# Patient Record
Sex: Female | Born: 2000
Health system: Southern US, Community
[De-identification: ages and names within clinical notes are randomized; demographics above are authoritative.]

## PROBLEM LIST (undated history)

## (undated) ENCOUNTER — Inpatient Hospital Stay (HOSPITAL_COMMUNITY): Payer: Self-pay

## (undated) DIAGNOSIS — Z789 Other specified health status: Secondary | ICD-10-CM

## (undated) HISTORY — DX: Other specified health status: Z78.9

## (undated) HISTORY — PX: NO PAST SURGERIES: SHX2092

---
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
## 2020-06-26 DIAGNOSIS — Z3400 Encounter for supervision of normal first pregnancy, unspecified trimester: Secondary | ICD-10-CM | POA: Diagnosis not present

## 2020-06-26 DIAGNOSIS — Z3401 Encounter for supervision of normal first pregnancy, first trimester: Secondary | ICD-10-CM | POA: Diagnosis not present

## 2020-06-26 DIAGNOSIS — Z3201 Encounter for pregnancy test, result positive: Secondary | ICD-10-CM | POA: Diagnosis not present

## 2020-06-26 LAB — OB RESULTS CONSOLE HIV ANTIBODY (ROUTINE TESTING): HIV: NONREACTIVE

## 2020-06-26 LAB — OB RESULTS CONSOLE HEPATITIS B SURFACE ANTIGEN: Hepatitis B Surface Ag: NEGATIVE

## 2020-06-26 LAB — OB RESULTS CONSOLE RUBELLA ANTIBODY, IGM: Rubella: IMMUNE

## 2020-07-07 DIAGNOSIS — O209 Hemorrhage in early pregnancy, unspecified: Secondary | ICD-10-CM | POA: Diagnosis not present

## 2020-07-07 DIAGNOSIS — Z3401 Encounter for supervision of normal first pregnancy, first trimester: Secondary | ICD-10-CM | POA: Diagnosis not present

## 2020-08-18 DIAGNOSIS — Z3A18 18 weeks gestation of pregnancy: Secondary | ICD-10-CM | POA: Diagnosis not present

## 2020-08-18 DIAGNOSIS — Z36 Encounter for antenatal screening for chromosomal anomalies: Secondary | ICD-10-CM | POA: Diagnosis not present

## 2020-08-18 DIAGNOSIS — Z3402 Encounter for supervision of normal first pregnancy, second trimester: Secondary | ICD-10-CM | POA: Diagnosis not present

## 2020-09-01 DIAGNOSIS — Z202 Contact with and (suspected) exposure to infections with a predominantly sexual mode of transmission: Secondary | ICD-10-CM | POA: Diagnosis not present

## 2020-09-01 LAB — OB RESULTS CONSOLE GC/CHLAMYDIA
Chlamydia: NEGATIVE
Gonorrhea: NEGATIVE

## 2020-10-17 NOTE — L&D Delivery Note (Signed)
Delivery Note At 5:52 PM a viable female was delivered via Vaginal, Spontaneous (Presentation:   Occiput Anterior).  APGAR: 8, 9; weight  .   Placenta status: Spontaneous, Intact.  Cord: 3 vessels with the following complications: None.  Cord pH: NA  Anesthesia: Epidural Episiotomy: None Lacerations: 1st degree;Perineal bilateral labia lacerations  Suture Repair: 3.0 vicryl Est. Blood Loss (mL):  400 mL uterine atony noted resolved with pitocin, fundal message and 1000 mcg of cytotec per rectum   Mom to postpartum.  Baby to Couplet care / Skin to Skin.  Gerald Leitz 01/09/2021, 6:45 PM

## 2020-10-21 DIAGNOSIS — Z3490 Encounter for supervision of normal pregnancy, unspecified, unspecified trimester: Secondary | ICD-10-CM | POA: Diagnosis not present

## 2020-10-21 LAB — OB RESULTS CONSOLE RPR: RPR: NONREACTIVE

## 2020-11-23 DIAGNOSIS — Z23 Encounter for immunization: Secondary | ICD-10-CM | POA: Diagnosis not present

## 2020-12-21 DIAGNOSIS — Z3403 Encounter for supervision of normal first pregnancy, third trimester: Secondary | ICD-10-CM | POA: Diagnosis not present

## 2020-12-21 DIAGNOSIS — Z3490 Encounter for supervision of normal pregnancy, unspecified, unspecified trimester: Secondary | ICD-10-CM | POA: Diagnosis not present

## 2020-12-21 LAB — OB RESULTS CONSOLE GBS: GBS: NEGATIVE

## 2021-01-04 ENCOUNTER — Telehealth (HOSPITAL_COMMUNITY): Payer: Self-pay | Admitting: *Deleted

## 2021-01-04 ENCOUNTER — Encounter (HOSPITAL_COMMUNITY): Payer: Self-pay | Admitting: *Deleted

## 2021-01-04 NOTE — Telephone Encounter (Signed)
Preadmission screen  

## 2021-01-07 ENCOUNTER — Other Ambulatory Visit (HOSPITAL_COMMUNITY)
Admission: RE | Admit: 2021-01-07 | Discharge: 2021-01-07 | Disposition: A | Discharge status: Home / Self Care | Payer: Medicaid Other | Source: Ambulatory Visit | Attending: Obstetrics and Gynecology | Admitting: Obstetrics and Gynecology

## 2021-01-07 LAB — SARS CORONAVIRUS 2 (TAT 6-24 HRS): SARS Coronavirus 2: NEGATIVE

## 2021-01-08 ENCOUNTER — Other Ambulatory Visit: Payer: Self-pay

## 2021-01-08 ENCOUNTER — Other Ambulatory Visit: Payer: Self-pay | Admitting: Obstetrics and Gynecology

## 2021-01-08 NOTE — H&P (Signed)
Carol Lopez is a 20 y.o. female G1P0 at 70 weeks and 2 days presenting for induction of labor due to term pregnancy with favorable cervix. Pregnancy has been uncomplicated. Prenatal care provided by Dr. Gerald Leitz with [facility].  . OB History    Gravida  1   Para      Term      Preterm      AB      Living        SAB      IAB      Ectopic      Multiple      Live Births             Past Medical History:  Diagnosis Date  . Medical history non-contributory    Past Surgical History:  Procedure Laterality Date  . NO PAST SURGERIES     Family History: family history includes Diabetes in her maternal grandmother and mother. Social History:  reports that she has never smoked. She has never used smokeless tobacco. No history on file for alcohol use and drug use.     Maternal Diabetes: No Genetic Screening: Declined Maternal Ultrasounds/Referrals: Normal Fetal Ultrasounds or other Referrals:  None Maternal Substance Abuse:  No Significant Maternal Medications:  None Significant Maternal Lab Results:  Group B Strep negative Other Comments:  None  Review of Systems  Constitutional: Negative.   HENT: Negative.   Eyes: Negative.   Respiratory: Negative.   Cardiovascular: Negative.   Gastrointestinal: Negative.   Endocrine: Negative.   Genitourinary: Negative.   Musculoskeletal: Negative.   Skin: Negative.   Allergic/Immunologic: Negative.   Neurological: Negative.   Hematological: Negative.   Psychiatric/Behavioral: Negative.    History   There were no vitals taken for this visit. Maternal Exam:  Introitus: Normal vulva.   Physical Exam Vitals reviewed.  Constitutional:      Appearance: Normal appearance.  HENT:     Head: Normocephalic and atraumatic.  Eyes:     Pupils: Pupils are equal, round, and reactive to light.  Cardiovascular:     Rate and Rhythm: Normal rate and regular rhythm.     Pulses: Normal pulses.     Heart sounds: Normal  heart sounds.  Pulmonary:     Effort: Pulmonary effort is normal.     Breath sounds: Normal breath sounds.  Abdominal:     Tenderness: There is no abdominal tenderness.  Genitourinary:    General: Normal vulva.  Musculoskeletal:        General: Normal range of motion.     Cervical back: Normal range of motion and neck supple.  Skin:    General: Skin is warm and dry.  Neurological:     General: No focal deficit present.     Mental Status: She is alert and oriented to person, place, and time.  Psychiatric:        Mood and Affect: Mood normal.        Behavior: Behavior normal.     Prenatal labs: ABO, Rh:  B positive  Antibody:  Negative  Rubella:  Immune  RPR:   Nonreactive  HBsAg:  Negative   HIV:   Negative  GBS:  Negative on 12/21/2020   Assessment/Plan: 39 weeks and 2 days for induction  Favorable cervix start pitocin 1x1 Plan AROM with fetal descent  Anticipate SVD    Gerald Leitz 01/08/2021, 5:55 PM

## 2021-01-08 NOTE — H&P (Deleted)
  The note originally documented on this encounter has been moved the the encounter in which it belongs.  

## 2021-01-09 ENCOUNTER — Encounter (HOSPITAL_COMMUNITY): Payer: Self-pay | Admitting: Obstetrics and Gynecology

## 2021-01-09 ENCOUNTER — Other Ambulatory Visit: Payer: Self-pay

## 2021-01-09 ENCOUNTER — Inpatient Hospital Stay (HOSPITAL_COMMUNITY)
Admission: AD | Admit: 2021-01-09 | Discharge: 2021-01-11 | DRG: 806 | Disposition: A | Discharge status: Home / Self Care | Payer: Medicaid Other | Attending: Obstetrics and Gynecology | Admitting: Obstetrics and Gynecology

## 2021-01-09 ENCOUNTER — Inpatient Hospital Stay (HOSPITAL_COMMUNITY): Payer: Medicaid Other | Admitting: Anesthesiology

## 2021-01-09 ENCOUNTER — Inpatient Hospital Stay (HOSPITAL_COMMUNITY): Payer: Medicaid Other

## 2021-01-09 DIAGNOSIS — O411230 Chorioamnionitis, third trimester, not applicable or unspecified: Secondary | ICD-10-CM | POA: Diagnosis not present

## 2021-01-09 DIAGNOSIS — O411231 Chorioamnionitis, third trimester, fetus 1: Secondary | ICD-10-CM

## 2021-01-09 DIAGNOSIS — Z3A39 39 weeks gestation of pregnancy: Secondary | ICD-10-CM

## 2021-01-09 DIAGNOSIS — O9081 Anemia of the puerperium: Secondary | ICD-10-CM | POA: Diagnosis not present

## 2021-01-09 DIAGNOSIS — Z20822 Contact with and (suspected) exposure to covid-19: Secondary | ICD-10-CM | POA: Diagnosis present

## 2021-01-09 DIAGNOSIS — Z3490 Encounter for supervision of normal pregnancy, unspecified, unspecified trimester: Secondary | ICD-10-CM

## 2021-01-09 DIAGNOSIS — D62 Acute posthemorrhagic anemia: Secondary | ICD-10-CM | POA: Diagnosis not present

## 2021-01-09 DIAGNOSIS — O26893 Other specified pregnancy related conditions, third trimester: Secondary | ICD-10-CM | POA: Diagnosis not present

## 2021-01-09 LAB — CBC
HCT: 29.6 % — ABNORMAL LOW (ref 36.0–46.0)
Hemoglobin: 9.4 g/dL — ABNORMAL LOW (ref 12.0–15.0)
MCH: 25.1 pg — ABNORMAL LOW (ref 26.0–34.0)
MCHC: 31.8 g/dL (ref 30.0–36.0)
MCV: 79.1 fL — ABNORMAL LOW (ref 80.0–100.0)
Platelets: 253 10*3/uL (ref 150–400)
RBC: 3.74 MIL/uL — ABNORMAL LOW (ref 3.87–5.11)
RDW: 14.1 % (ref 11.5–15.5)
WBC: 8.9 10*3/uL (ref 4.0–10.5)
nRBC: 0 % (ref 0.0–0.2)

## 2021-01-09 LAB — RPR: RPR Ser Ql: NONREACTIVE

## 2021-01-09 LAB — TYPE AND SCREEN
ABO/RH(D): B POS
Antibody Screen: NEGATIVE

## 2021-01-09 MED ORDER — ACETAMINOPHEN 325 MG PO TABS
650.0000 mg | ORAL_TABLET | ORAL | Status: DC | PRN
  Filled 2021-01-09: qty 2

## 2021-01-09 MED ORDER — MISOPROSTOL 200 MCG PO TABS
ORAL_TABLET | ORAL | Status: AC
  Filled 2021-01-09: qty 5

## 2021-01-09 MED ORDER — METHYLERGONOVINE MALEATE 0.2 MG PO TABS: 0.2000 mg | ORAL_TABLET | ORAL | Status: DC | PRN

## 2021-01-09 MED ORDER — WITCH HAZEL-GLYCERIN EX PADS: 1.0000 "application " | MEDICATED_PAD | CUTANEOUS | Status: DC | PRN

## 2021-01-09 MED ORDER — OXYCODONE-ACETAMINOPHEN 5-325 MG PO TABS
2.0000 | ORAL_TABLET | ORAL | Status: DC | PRN
Start: 2021-01-09 — End: 2021-01-09

## 2021-01-09 MED ORDER — EPHEDRINE 5 MG/ML INJ: 10.0000 mg | INTRAVENOUS | Status: DC | PRN

## 2021-01-09 MED ORDER — LACTATED RINGERS IV SOLN: 500.0000 mL | Freq: Once | INTRAVENOUS | Status: DC

## 2021-01-09 MED ORDER — OXYCODONE HCL 5 MG PO TABS: 5.0000 mg | ORAL_TABLET | ORAL | Status: DC | PRN

## 2021-01-09 MED ORDER — ONDANSETRON HCL 4 MG PO TABS: 4.0000 mg | ORAL_TABLET | ORAL | Status: DC | PRN

## 2021-01-09 MED ORDER — FENTANYL CITRATE (PF) 100 MCG/2ML IJ SOLN: 50.0000 ug | INTRAMUSCULAR | Status: DC | PRN

## 2021-01-09 MED ORDER — SODIUM CHLORIDE 0.9 % IV SOLN
3.0000 g | Freq: Four times a day (QID) | INTRAVENOUS | Status: AC
  Administered 2021-01-09 – 2021-01-10 (×4): 3 g via INTRAVENOUS
  Filled 2021-01-09 (×2): qty 3
  Filled 2021-01-09 (×2): qty 8

## 2021-01-09 MED ORDER — LACTATED RINGERS IV SOLN
500.0000 mL | Freq: Once | INTRAVENOUS | Status: AC
  Administered 2021-01-09: 500 mL via INTRAVENOUS

## 2021-01-09 MED ORDER — OXYTOCIN BOLUS FROM INFUSION
333.0000 mL | Freq: Once | INTRAVENOUS | Status: AC
  Administered 2021-01-09: 333 mL via INTRAVENOUS

## 2021-01-09 MED ORDER — OXYCODONE-ACETAMINOPHEN 5-325 MG PO TABS: 1.0000 | ORAL_TABLET | ORAL | Status: DC | PRN

## 2021-01-09 MED ORDER — ACETAMINOPHEN 500 MG PO TABS
1000.0000 mg | ORAL_TABLET | Freq: Once | ORAL | Status: AC
  Administered 2021-01-09: 1000 mg via ORAL
  Filled 2021-01-09: qty 2

## 2021-01-09 MED ORDER — IBUPROFEN 600 MG PO TABS
600.0000 mg | ORAL_TABLET | Freq: Four times a day (QID) | ORAL | Status: DC
  Administered 2021-01-10 – 2021-01-11 (×6): 600 mg via ORAL
  Filled 2021-01-09 (×7): qty 1

## 2021-01-09 MED ORDER — SENNOSIDES-DOCUSATE SODIUM 8.6-50 MG PO TABS
2.0000 | ORAL_TABLET | Freq: Every day | ORAL | Status: DC
  Filled 2021-01-09 (×2): qty 2

## 2021-01-09 MED ORDER — DIPHENHYDRAMINE HCL 25 MG PO CAPS: 25.0000 mg | ORAL_CAPSULE | Freq: Four times a day (QID) | ORAL | Status: DC | PRN

## 2021-01-09 MED ORDER — FERROUS SULFATE 325 (65 FE) MG PO TABS
325.0000 mg | ORAL_TABLET | Freq: Every day | ORAL | Status: DC
  Administered 2021-01-10 – 2021-01-11 (×2): 325 mg via ORAL
  Filled 2021-01-09 (×2): qty 1

## 2021-01-09 MED ORDER — TERBUTALINE SULFATE 1 MG/ML IJ SOLN: 0.2500 mg | Freq: Once | INTRAMUSCULAR | Status: DC | PRN

## 2021-01-09 MED ORDER — FENTANYL-BUPIVACAINE-NACL 0.5-0.125-0.9 MG/250ML-% EP SOLN
12.0000 mL/h | EPIDURAL | Status: DC | PRN
  Administered 2021-01-09: 12 mL/h via EPIDURAL
  Filled 2021-01-09: qty 250

## 2021-01-09 MED ORDER — OXYCODONE HCL 5 MG PO TABS: 10.0000 mg | ORAL_TABLET | ORAL | Status: DC | PRN

## 2021-01-09 MED ORDER — HYDROXYZINE HCL 50 MG PO TABS: 50.0000 mg | ORAL_TABLET | Freq: Four times a day (QID) | ORAL | Status: DC | PRN

## 2021-01-09 MED ORDER — DIBUCAINE (PERIANAL) 1 % EX OINT: 1.0000 "application " | TOPICAL_OINTMENT | CUTANEOUS | Status: DC | PRN

## 2021-01-09 MED ORDER — METHYLERGONOVINE MALEATE 0.2 MG/ML IJ SOLN: 0.2000 mg | INTRAMUSCULAR | Status: DC | PRN

## 2021-01-09 MED ORDER — ONDANSETRON HCL 4 MG/2ML IJ SOLN: 4.0000 mg | Freq: Four times a day (QID) | INTRAMUSCULAR | Status: DC | PRN

## 2021-01-09 MED ORDER — PHENYLEPHRINE 40 MCG/ML (10ML) SYRINGE FOR IV PUSH (FOR BLOOD PRESSURE SUPPORT): 80.0000 ug | PREFILLED_SYRINGE | INTRAVENOUS | Status: DC | PRN

## 2021-01-09 MED ORDER — ZOLPIDEM TARTRATE 5 MG PO TABS
5.0000 mg | ORAL_TABLET | Freq: Every evening | ORAL | Status: DC | PRN
Start: 2021-01-09 — End: 2021-01-11

## 2021-01-09 MED ORDER — SOD CITRATE-CITRIC ACID 500-334 MG/5ML PO SOLN: 30.0000 mL | ORAL | Status: DC | PRN

## 2021-01-09 MED ORDER — OXYTOCIN-SODIUM CHLORIDE 30-0.9 UT/500ML-% IV SOLN
1.0000 m[IU]/min | INTRAVENOUS | Status: DC
  Administered 2021-01-09: 1 m[IU]/min via INTRAVENOUS
  Filled 2021-01-09: qty 500

## 2021-01-09 MED ORDER — PHENYLEPHRINE 40 MCG/ML (10ML) SYRINGE FOR IV PUSH (FOR BLOOD PRESSURE SUPPORT)
80.0000 ug | PREFILLED_SYRINGE | INTRAVENOUS | Status: DC | PRN
  Filled 2021-01-09: qty 10

## 2021-01-09 MED ORDER — ACETAMINOPHEN 325 MG PO TABS: 650.0000 mg | ORAL_TABLET | ORAL | Status: DC | PRN

## 2021-01-09 MED ORDER — LACTATED RINGERS IV SOLN: INTRAVENOUS | Status: DC

## 2021-01-09 MED ORDER — OXYTOCIN-SODIUM CHLORIDE 30-0.9 UT/500ML-% IV SOLN: 2.5000 [IU]/h | INTRAVENOUS | Status: DC

## 2021-01-09 MED ORDER — LACTATED RINGERS IV SOLN
500.0000 mL | INTRAVENOUS | Status: DC | PRN
  Administered 2021-01-09: 500 mL via INTRAVENOUS

## 2021-01-09 MED ORDER — COCONUT OIL OIL: 1.0000 "application " | TOPICAL_OIL | Status: DC | PRN

## 2021-01-09 MED ORDER — DIPHENHYDRAMINE HCL 50 MG/ML IJ SOLN: 12.5000 mg | INTRAMUSCULAR | Status: DC | PRN

## 2021-01-09 MED ORDER — SIMETHICONE 80 MG PO CHEW
80.0000 mg | CHEWABLE_TABLET | ORAL | Status: DC | PRN
Start: 2021-01-09 — End: 2021-01-11

## 2021-01-09 MED ORDER — LIDOCAINE HCL (PF) 1 % IJ SOLN
30.0000 mL | INTRAMUSCULAR | Status: AC | PRN
  Administered 2021-01-09: 30 mL via SUBCUTANEOUS
  Filled 2021-01-09: qty 30

## 2021-01-09 MED ORDER — MISOPROSTOL 200 MCG PO TABS
1000.0000 ug | ORAL_TABLET | Freq: Once | ORAL | Status: AC
  Administered 2021-01-09: 1200 ug via RECTAL

## 2021-01-09 MED ORDER — LIDOCAINE HCL (PF) 1 % IJ SOLN
INTRAMUSCULAR | Status: DC | PRN
  Administered 2021-01-09 (×2): 4 mL via EPIDURAL

## 2021-01-09 MED ORDER — PRENATAL MULTIVITAMIN CH
1.0000 | ORAL_TABLET | Freq: Every day | ORAL | Status: DC
  Administered 2021-01-10: 1 via ORAL
  Filled 2021-01-09 (×2): qty 1

## 2021-01-09 MED ORDER — BENZOCAINE-MENTHOL 20-0.5 % EX AERO
1.0000 "application " | INHALATION_SPRAY | CUTANEOUS | Status: DC | PRN
  Administered 2021-01-09: 1 via TOPICAL
  Filled 2021-01-09: qty 56

## 2021-01-09 MED ORDER — ONDANSETRON HCL 4 MG/2ML IJ SOLN: 4.0000 mg | INTRAMUSCULAR | Status: DC | PRN

## 2021-01-09 NOTE — Progress Notes (Signed)
Waiting for delivery of Placenta

## 2021-01-09 NOTE — Lactation Note (Signed)
This note was copied from a baby's chart. Lactation Consultation Note  Patient Name: Carol Lopez KWIOX'B Date: 01/09/2021 Reason for consult: Follow-up assessment Age:20 hours  Mom sitting in bed holding sleeping baby (pacifier in mouth), dad sitting on couch. Mom reports baby last fed at 10:15-10:49pm, states unsure if baby has a good latch because baby keeps popping on and off the breast (baby maintained/ tolerated pacifier in mouth throughout visit). Mom denies pain or nipple damage. Mom's goal is to breastfeed for 50mo-51yr, ordered an electric breast pump but states it has not yet arrived.   Discussed cue based feedings, expect 8-12 q24hrs, wake baby if >3hrs since last feedings, skin to skin, avoid pacifiers and bottles x20mo unless indicated, signs of adequate milk transfer, hand express after every feeding and offer colostrum back to baby.   Advised no LC overnight, call RN if support needed prior to next [facility] visit. Mom voiced understanding and with no further concerns. Left the room with mom holding sleeping baby (without pacifier) and dad sitting on couch. BGilliam, RN, IBCLC  Maternal Data Has patient been taught Hand Expression?: Yes Does the patient have breastfeeding experience prior to this delivery?: No    Interventions Interventions: Breast feeding basics reviewed  Discharge WIC Program: No  Consult Status Consult Status: Follow-up Date: 01/10/21 Follow-up type: In-patient    Carol Lopez 01/09/2021, 11:47 PM

## 2021-01-09 NOTE — Plan of Care (Signed)
Pt demonstrated understanding 

## 2021-01-09 NOTE — Anesthesia Procedure Notes (Signed)
Epidural Patient location during procedure: OB Start time: 01/09/2021 10:41 AM End time: 01/09/2021 10:44 AM  Staffing Anesthesiologist: Kaylyn Layer, MD Performed: anesthesiologist   Preanesthetic Checklist Completed: patient identified, IV checked, risks and benefits discussed, monitors and equipment checked, pre-op evaluation and timeout performed  Epidural Patient position: sitting Prep: DuraPrep and site prepped and draped Patient monitoring: continuous pulse ox, blood pressure and heart rate Approach: midline Location: L3-L4 Injection technique: LOR air  Needle:  Needle type: Tuohy  Needle gauge: 17 G Needle length: 9 cm Needle insertion depth: 7 cm Catheter type: closed end flexible Catheter size: 19 Gauge Catheter at skin depth: 12 cm Test dose: negative and Other (1% lidocaine)  Assessment Events: blood not aspirated, injection not painful, no injection resistance, no paresthesia and negative IV test  Additional Notes Patient identified. Risks, benefits, and alternatives discussed with patient including but not limited to bleeding, infection, nerve damage, paralysis, failed block, incomplete pain control, headache, blood pressure changes, nausea, vomiting, reactions to medication, itching, and postpartum back pain. Confirmed with bedside nurse the patient's most recent platelet count. Confirmed with patient that they are not currently taking any anticoagulation, have any bleeding history, or any family history of bleeding disorders. Patient expressed understanding and wished to proceed. All questions were answered. Sterile technique was used throughout the entire procedure. Please see nursing notes for vital signs.   Crisp LOR on first pass. Test dose was given through epidural catheter and negative prior to continuing to dose epidural or start infusion. Warning signs of high block given to the patient including shortness of breath, tingling/numbness in hands, complete  motor block, or any concerning symptoms with instructions to call for help. Patient was given instructions on fall risk and not to get out of bed. All questions and concerns addressed with instructions to call with any issues or inadequate analgesia.  Reason for block:procedure for pain

## 2021-01-09 NOTE — Progress Notes (Signed)
   Subjective: Patient rates contractions as 4 out 10   Objective: BP 111/69   Pulse 98   Temp 98.2 F (36.8 C)   Resp 20   Ht 5\' 2"  (1.575 m)   Wt 85.3 kg   BMI 34.40 kg/m  No intake/output data recorded. No intake/output data recorded.  FHT:  FHR: 130 bpm, variability: moderate,  accelerations:  Present,  decelerations:  Absent UC:   irregular, every 1-4 minutes SVE:   Dilation: 3.5 Effacement (%): 90 Station: -1 Exam by:: Dr [email or web address] AROM clear fluid   Labs: Lab Results  Component Value Date   WBC 8.9 01/09/2021   HGB 9.4 (L) 01/09/2021   HCT 29.6 (L) 01/09/2021   MCV 79.1 (L) 01/09/2021   PLT 253 01/09/2021    Assessment / Plan: Induction of labor due to term with favorable cervix,  progressing well on pitocin  Labor: Progressing normally Preeclampsia:  NA Fetal Wellbeing:  Category I Pain Control:  Labor support without medications I/D:  n/a Anticipated MOD:  NSVD  01/11/2021 01/09/2021, 10:12 AM

## 2021-01-09 NOTE — Progress Notes (Signed)
Subjective: Patient reports feeling pressure 6 out of 10.   Objective: BP 111/62   Pulse 83   Temp 98.4 F (36.9 C) (Oral)   Resp 18   Ht 5\' 2"  (1.575 m)   Wt 85.3 kg   SpO2 99%   BMI 34.40 kg/m  No intake/output data recorded. Total I/O In: -  Out: 800 [Urine:800]  FHT:  FHR 140 moderate variability accelerations presents decelerations absent  UC:   regular, every 2 minutes SVE:  9/100/+1 exam by Dr.  Labs: Lab Results  Component Value Date   WBC 8.9 01/09/2021   HGB 9.4 (L) 01/09/2021   HCT 29.6 (L) 01/09/2021   MCV 79.1 (L) 01/09/2021   PLT 253 01/09/2021    Assessment / Plan: Induction of labor due to term with favorable cervix,  progressing well on pitocin  Labor: Progressing normally Preeclampsia:  NA Fetal Wellbeing:  Category I Pain Control:  Epidural I/D:  n/a Anticipated MOD:  NSVD  01/11/2021 01/09/2021, 2:31 PM

## 2021-01-09 NOTE — Lactation Note (Signed)
This note was copied from a baby's chart. Lactation Consultation Note  Patient Name: Carol Lopez [id number]'M Date: 01/09/2021   Age:20 hours LC attempted to see mom in labor and delivery.  RN reports mom had an extensive tear and will have to be repaired.  Dad holding baby.  RN to contact [facility] when mom ready to be seen Maternal Data    Feeding    LATCH Score                    Lactation Tools Discussed/Used    Interventions    Discharge    Consult Status      Neomia Dear 01/09/2021, 6:06 PM

## 2021-01-09 NOTE — Anesthesia Preprocedure Evaluation (Signed)
Anesthesia Evaluation  Patient identified by MRN, date of birth, ID band Patient awake    Reviewed: Allergy & Precautions, Patient's Chart, lab work & pertinent test results  History of Anesthesia Complications Negative for: history of anesthetic complications  Airway Mallampati: II  TM Distance: >3 FB Neck ROM: Full    Dental no notable dental hx.    Pulmonary neg pulmonary ROS,    Pulmonary exam normal        Cardiovascular negative cardio ROS Normal cardiovascular exam     Neuro/Psych negative neurological ROS  negative psych ROS   GI/Hepatic negative GI ROS, Neg liver ROS,   Endo/Other  negative endocrine ROS  Renal/GU negative Renal ROS  negative genitourinary   Musculoskeletal negative musculoskeletal ROS (+)   Abdominal   Peds  Hematology  (+) anemia , Hgb 9.4   Anesthesia Other Findings Day of surgery medications reviewed with patient.  Reproductive/Obstetrics (+) Pregnancy                             Anesthesia Physical Anesthesia Plan  ASA: II  Anesthesia Plan: Epidural   Post-op Pain Management:    Induction:   PONV Risk Score and Plan: Treatment may vary due to age or medical condition  Airway Management Planned: Natural Airway  Additional Equipment:   Intra-op Plan:   Post-operative Plan:   Informed Consent: I have reviewed the patients History and Physical, chart, labs and discussed the procedure including the risks, benefits and alternatives for the proposed anesthesia with the patient or authorized representative who has indicated his/her understanding and acceptance.       Plan Discussed with:   Anesthesia Plan Comments:         Anesthesia Quick Evaluation

## 2021-01-09 NOTE — Lactation Note (Signed)
Lactation Consultation Note  Patient Name: Carol Lopez CEYEM'V Date: 01/09/2021   Age:20 y.o.  Assisted with breastfeeding in labor and delivery.  Put infant STS.  And she gradually went towards the breast with a little assistance. Left mom and baby Breastfeeding  Maternal Data    Feeding    LATCH Score                    Lactation Tools Discussed/Used    Interventions    Discharge    Consult Status      Neomia Dear 01/09/2021, 7:26 PM

## 2021-01-09 NOTE — Progress Notes (Signed)
Waiting for delivery of placenta

## 2021-01-10 LAB — CBC
HCT: 24.2 % — ABNORMAL LOW (ref 36.0–46.0)
Hemoglobin: 7.8 g/dL — ABNORMAL LOW (ref 12.0–15.0)
MCH: 25.2 pg — ABNORMAL LOW (ref 26.0–34.0)
MCHC: 32.2 g/dL (ref 30.0–36.0)
MCV: 78.1 fL — ABNORMAL LOW (ref 80.0–100.0)
Platelets: 201 10*3/uL (ref 150–400)
RBC: 3.1 MIL/uL — ABNORMAL LOW (ref 3.87–5.11)
RDW: 14.3 % (ref 11.5–15.5)
WBC: 10.7 10*3/uL — ABNORMAL HIGH (ref 4.0–10.5)
nRBC: 0 % (ref 0.0–0.2)

## 2021-01-10 MED ORDER — IBUPROFEN 600 MG PO TABS: 600.0000 mg | ORAL_TABLET | Freq: Four times a day (QID) | ORAL | 1 refills | Status: DC | PRN

## 2021-01-10 MED ORDER — FERROUS SULFATE 325 (65 FE) MG PO TABS: 325.0000 mg | ORAL_TABLET | Freq: Every day | ORAL | 1 refills | Status: DC

## 2021-01-10 NOTE — Lactation Note (Signed)
This note was copied from a baby's chart. Lactation Consultation Note  Patient Name: Carol Lopez MCNOB'S Date: 01/10/2021 Reason for consult: Follow-up assessment;Term;Primapara;1st time breastfeeding Age:19 hours   P1 mother whose infant is now 71 hours old.  This is a term baby at 39+2 weeks.  Mother requested latch assistance.    Reviewed breast feeding basics with family.  Asked mother to demonstrate hand expression.  Mother needed review on her technique.  She was able to express drops which I finger fed to baby.  Assisted to latch to the left breast in the football hold easily.  Once at the breast, baby required gentle stimulation to begin sucking.  Demonstrated breast compressions and gentle stimulation and she continued to suck for 5 minutes.  Mother denied pain with latching.    Discussed feeding cues and mother will feed 8-12 times/24 hours.  She will call for latch assistance as needed.  Mother has ordered a DEBP, however, it has not yet arrived.  RN updated.    Maternal Data Has patient been taught Hand Expression?: Yes Does the patient have breastfeeding experience prior to this delivery?: No  Feeding Mother's Current Feeding Choice: Breast Milk  LATCH Score Latch: Repeated attempts needed to sustain latch, nipple held in mouth throughout feeding, stimulation needed to elicit sucking reflex.  Audible Swallowing: None  Type of Nipple: Everted at rest and after stimulation  Comfort (Breast/Nipple): Soft / non-tender  Hold (Positioning): Assistance needed to correctly position infant at breast and maintain latch.  LATCH Score: 6   Lactation Tools Discussed/Used    Interventions Interventions: Breast feeding basics reviewed;Assisted with latch;Skin to skin;Breast massage;Hand express;Breast compression;Adjust position;Position options;Support pillows;Education  Discharge Pump: Personal  Consult Status Consult Status: Follow-up Date:  01/11/21 Follow-up type: In-patient    Lizzy Hamre R Kayelee Herbig 01/10/2021, 1:40 PM

## 2021-01-10 NOTE — Anesthesia Postprocedure Evaluation (Signed)
Anesthesia Post Note  Patient: Carol Lopez  Procedure(s) Performed: AN AD HOC LABOR EPIDURAL     Patient location during evaluation: Mother Baby Anesthesia Type: Epidural Level of consciousness: awake and alert Pain management: pain level controlled Vital Signs Assessment: post-procedure vital signs reviewed and stable Respiratory status: spontaneous breathing, nonlabored ventilation and respiratory function stable Cardiovascular status: stable Postop Assessment: no headache, no backache and epidural receding Anesthetic complications: no   No complications documented.  Last Vitals:  Vitals:   01/10/21 0551 01/10/21 0853  BP: 112/79 109/71  Pulse: 87 86  Resp: 18 20  Temp: 36.6 C 36.8 C  SpO2: 100%     Last Pain:  Vitals:   01/10/21 0729  TempSrc:   PainSc: Asleep   Pain Goal:                   Rica Records

## 2021-01-10 NOTE — Progress Notes (Signed)
Post Partum Day 1 Subjective: no complaints, up ad lib, voiding, tolerating PO and + flatus  Objective: Blood pressure 109/71, pulse 86, temperature 98.3 F (36.8 C), resp. rate 20, height 5\' 2"  (1.575 m), weight 85.3 kg, SpO2 100 %, unknown if currently breastfeeding.  Physical Exam:  General: alert, cooperative and no distress Lochia: appropriate Uterine Fundus: firm Incision: NA DVT Evaluation: No evidence of DVT seen on physical exam.  Recent Labs    01/09/21 0037 01/10/21 0520  HGB 9.4* 7.8*  HCT 29.6* 24.2*    Assessment/Plan: Plan for discharge tomorrow  Acute blood loss Anemia - ferrous sulfate daily with breakfast  Breastfeeding- lactation consultation  Routine postpartum care    LOS: 1 day   01/12/21 01/10/2021, 10:36 AM

## 2021-01-11 NOTE — Lactation Note (Signed)
This note was copied from a baby's chart. Lactation Consultation Note  Patient Name: Carol Lopez KPTWS'F Date: 01/11/2021 Reason for consult: Follow-up assessment Age:20 hours   LC Follow Up Note:  Per flowsheet and RN's note, baby has now had a bowel movement.     Maternal Data    Feeding Nipple Type: Extra Slow Flow  LATCH Score                    Lactation Tools Discussed/Used    Interventions    Discharge Discharge Education: Engorgement and breast care  Consult Status Consult Status: Complete Date: 01/11/21 Follow-up type: Call as needed    Marlin Jarrard R Dezmon Conover 01/11/2021, 11:06 AM

## 2021-01-11 NOTE — Lactation Note (Signed)
This note was copied from a baby's chart. Lactation Consultation Note  Patient Name: Carol Lopez YSAYT'K Date: 01/11/2021 Reason for consult: Follow-up assessment Age:20 hours   P1 mother whose infant is now 29 hours old.  This is a term baby at 39+2 weeks.  Mother is breast feeding and supplementing with formula.  She formula fed all night due to baby not stooling since birth.   Mother had no questions/concerns related to breast feeding.  I offered to return at the next feeding to assist with latching if desired.  Baby has had multiple voids.  Last LATCH score yesterday afternoon was a 6.  Grandmother was able to bring in mother's DEBP yesterday.  Mother has been pumping in the hospital. Family is anticipating discharge today; will continue to monitor for baby's first stool.  Father present.   Maternal Data    Feeding Nipple Type: Extra Slow Flow  LATCH Score                    Lactation Tools Discussed/Used    Interventions    Discharge Discharge Education: Engorgement and breast care  Consult Status Consult Status: Complete Date: 01/11/21 Follow-up type: Call as needed    Beth R DelFava 01/11/2021, 8:07 AM

## 2021-01-11 NOTE — Discharge Summary (Signed)
Postpartum Discharge Summary  Date of Service updated 01/11/2021     Patient Name: Carol Lopez DOB: 02-11-2001 MRN: [id number]  Date of admission: 01/09/2021 Delivery date:01/09/2021  Delivering provider: Christophe Louis  Date of discharge: 01/11/2021  Admitting diagnosis: Term pregnancy [Z34.90] Intrauterine pregnancy: [redacted]w[redacted]d     Secondary diagnosis:  Active Problems:   Term pregnancy Chorioamnionitis  Additional problems: None    Discharge diagnosis: Term Pregnancy Delivered                                              Post partum procedures: Chorioamnionitis treted with Unasyn 3 grams IV q 6 hours x 4 doses  Augmentation: AROM and Pitocin Complications: None  Hospital course: Induction of Labor With Vaginal Delivery   20 y.o. yo G1P1001 at [redacted]w[redacted]d was admitted to the hospital 01/09/2021 for induction of labor.  Indication for induction: Favorable cervix at term.  Patient had an uncomplicated labor course as follows: Membrane Rupture Time/Date: 9:56 AM ,01/09/2021   Delivery Method:Vaginal, Spontaneous  Episiotomy: None  Lacerations:  1st degree;Perineal  Details of delivery can be found in separate delivery note.  Patient had a routine postpartum course. Patient is discharged home 01/11/21.  Newborn Data: Birth date:01/09/2021  Birth time:5:52 PM  Gender:Female  Living status:Living  Apgars:8 ,9  Weight:3470 g   Magnesium Sulfate received: No BMZ received: No Rhophylac:N/A MMR:N/A T-DaP:Given prenatally Flu: N/A Transfusion:No  Physical exam  Vitals:   01/10/21 0853 01/10/21 1533 01/10/21 2020 01/11/21 0508  BP: 109/71 113/70 106/69 123/77  Pulse: 86 63 79 69  Resp: $Remo'20 18 16 16  'YdKtN$ Temp: 98.3 F (36.8 C) 98.1 F (36.7 C) 98.7 F (37.1 C) 98.3 F (36.8 C)  TempSrc:   Oral Oral  SpO2:   100% 100%  Weight:      Height:       General: alert, cooperative and no distress Lochia: appropriate Uterine Fundus: firm Incision: N/A DVT Evaluation: No evidence of  DVT seen on physical exam. Labs: Lab Results  Component Value Date   WBC 10.7 (H) 01/10/2021   HGB 7.8 (L) 01/10/2021   HCT 24.2 (L) 01/10/2021   MCV 78.1 (L) 01/10/2021   PLT 201 01/10/2021   No flowsheet data found. Edinburgh Score: No flowsheet data found.    After visit meds:  Allergies as of 01/11/2021   No Known Allergies     Medication List    TAKE these medications   ferrous sulfate 325 (65 FE) MG tablet Take 1 tablet (325 mg total) by mouth daily with breakfast.   ibuprofen 600 MG tablet Commonly known as: ADVIL Take 1 tablet (600 mg total) by mouth every 6 (six) hours as needed.        Discharge home in stable condition Infant Feeding: Bottle and Breast Infant Disposition:home with mother Discharge instruction: per After Visit Summary and Postpartum booklet. Activity: Advance as tolerated. Pelvic rest for 6 weeks.  Diet: routine diet Anticipated Birth Control: Unsure Postpartum Appointment:6 weeks Additional Postpartum F/U: None Future Appointments:No future appointments. Follow up Visit:  Follow-up Information    Christophe Louis, MD. Go in 6 week(s).   Specialty: Obstetrics and Gynecology Why: please keep your appt for postpartum visit with Dr. Landry Mellow in 6 weeks  Contact information: [address] [employer] [place] [id number] [phone]  01/11/2021 Christophe Louis, MD

## 2021-02-05 ENCOUNTER — Encounter (HOSPITAL_COMMUNITY): Payer: Self-pay | Admitting: *Deleted

## 2021-02-05 ENCOUNTER — Encounter (HOSPITAL_COMMUNITY): Payer: Self-pay

## 2021-02-05 ENCOUNTER — Inpatient Hospital Stay (HOSPITAL_COMMUNITY)
Admission: AD | Admit: 2021-02-05 | Discharge: 2021-02-07 | DRG: 776 | Disposition: A | Discharge status: Home / Self Care | Payer: Federal, State, Local not specified - PPO | Attending: Obstetrics and Gynecology | Admitting: Obstetrics and Gynecology

## 2021-02-05 ENCOUNTER — Ambulatory Visit (HOSPITAL_COMMUNITY)
Admission: EM | Admit: 2021-02-05 | Discharge: 2021-02-05 | Disposition: A | Discharge status: Home / Self Care | Payer: Federal, State, Local not specified - PPO | Attending: Student | Admitting: Student

## 2021-02-05 ENCOUNTER — Other Ambulatory Visit: Payer: Self-pay

## 2021-02-05 DIAGNOSIS — Z20822 Contact with and (suspected) exposure to covid-19: Secondary | ICD-10-CM | POA: Diagnosis present

## 2021-02-05 DIAGNOSIS — R509 Fever, unspecified: Secondary | ICD-10-CM

## 2021-02-05 DIAGNOSIS — R0689 Other abnormalities of breathing: Secondary | ICD-10-CM | POA: Diagnosis not present

## 2021-02-05 DIAGNOSIS — O9229 Other disorders of breast associated with pregnancy and the puerperium: Secondary | ICD-10-CM | POA: Diagnosis not present

## 2021-02-05 DIAGNOSIS — A419 Sepsis, unspecified organism: Secondary | ICD-10-CM

## 2021-02-05 DIAGNOSIS — I959 Hypotension, unspecified: Secondary | ICD-10-CM | POA: Diagnosis not present

## 2021-02-05 DIAGNOSIS — N61 Mastitis without abscess: Secondary | ICD-10-CM | POA: Diagnosis not present

## 2021-02-05 DIAGNOSIS — O9122 Nonpurulent mastitis associated with the puerperium: Principal | ICD-10-CM | POA: Diagnosis present

## 2021-02-05 DIAGNOSIS — O864 Pyrexia of unknown origin following delivery: Secondary | ICD-10-CM | POA: Diagnosis present

## 2021-02-05 DIAGNOSIS — R000 Tachycardia, unspecified: Secondary | ICD-10-CM | POA: Diagnosis not present

## 2021-02-05 DIAGNOSIS — R519 Headache, unspecified: Secondary | ICD-10-CM | POA: Diagnosis not present

## 2021-02-05 LAB — COMPREHENSIVE METABOLIC PANEL
ALT: 16 U/L (ref 0–44)
AST: 22 U/L (ref 15–41)
Albumin: 3.3 g/dL — ABNORMAL LOW (ref 3.5–5.0)
Alkaline Phosphatase: 61 U/L (ref 38–126)
Anion gap: 10 (ref 5–15)
BUN: 8 mg/dL (ref 6–20)
CO2: 21 mmol/L — ABNORMAL LOW (ref 22–32)
Calcium: 8.4 mg/dL — ABNORMAL LOW (ref 8.9–10.3)
Chloride: 107 mmol/L (ref 98–111)
Creatinine, Ser: 0.95 mg/dL (ref 0.44–1.00)
GFR, Estimated: >60 mL/min (ref >60)
Glucose, Bld: 97 mg/dL (ref 70–99)
Potassium: 3.4 mmol/L — ABNORMAL LOW (ref 3.5–5.1)
Sodium: 138 mmol/L (ref 135–145)
Total Bilirubin: 0.8 mg/dL (ref 0.3–1.2)
Total Protein: 6 g/dL — ABNORMAL LOW (ref 6.5–8.1)

## 2021-02-05 LAB — CBC WITH DIFFERENTIAL/PLATELET
Abs Immature Granulocytes: 0.06 10*3/uL (ref 0.00–0.07)
Basophils Absolute: 0 10*3/uL (ref 0.0–0.1)
Basophils Relative: 0 %
Eosinophils Absolute: 0 10*3/uL (ref 0.0–0.5)
Eosinophils Relative: 0 %
HCT: 30 % — ABNORMAL LOW (ref 36.0–46.0)
Hemoglobin: 9.5 g/dL — ABNORMAL LOW (ref 12.0–15.0)
Immature Granulocytes: 1 %
Lymphocytes Relative: 7 %
Lymphs Abs: 0.9 10*3/uL (ref 0.7–4.0)
MCH: 24.5 pg — ABNORMAL LOW (ref 26.0–34.0)
MCHC: 31.7 g/dL (ref 30.0–36.0)
MCV: 77.5 fL — ABNORMAL LOW (ref 80.0–100.0)
Monocytes Absolute: 0.5 10*3/uL (ref 0.1–1.0)
Monocytes Relative: 4 %
Neutro Abs: 11.1 10*3/uL — ABNORMAL HIGH (ref 1.7–7.7)
Neutrophils Relative %: 88 %
Platelets: 222 10*3/uL (ref 150–400)
RBC: 3.87 MIL/uL (ref 3.87–5.11)
RDW: 15.9 % — ABNORMAL HIGH (ref 11.5–15.5)
WBC: 12.5 10*3/uL — ABNORMAL HIGH (ref 4.0–10.5)
nRBC: 0 % (ref 0.0–0.2)

## 2021-02-05 LAB — RESP PANEL BY RT-PCR (FLU A&B, COVID) ARPGX2
Influenza A by PCR: NEGATIVE
Influenza B by PCR: NEGATIVE
SARS Coronavirus 2 by RT PCR: NEGATIVE

## 2021-02-05 LAB — PROTIME-INR
INR: 1.3 — ABNORMAL HIGH (ref 0.8–1.2)
Prothrombin Time: 16.4 seconds — ABNORMAL HIGH (ref 11.4–15.2)

## 2021-02-05 LAB — CREATININE, SERUM
Creatinine, Ser: 0.96 mg/dL (ref 0.44–1.00)
GFR, Estimated: >60 mL/min (ref >60)

## 2021-02-05 LAB — LACTIC ACID, PLASMA
Lactic Acid, Venous: 1.1 mmol/L (ref 0.5–1.9)
Lactic Acid, Venous: 1 mmol/L (ref 0.5–1.9)

## 2021-02-05 LAB — APTT: aPTT: 34 seconds (ref 24–36)

## 2021-02-05 MED ORDER — SODIUM CHLORIDE 0.9 % IV SOLN
2.0000 g | Freq: Four times a day (QID) | INTRAVENOUS | Status: DC
  Administered 2021-02-05: 2 g via INTRAVENOUS
  Filled 2021-02-05: qty 2000

## 2021-02-05 MED ORDER — LACTATED RINGERS IV SOLN: INTRAVENOUS | Status: AC

## 2021-02-05 MED ORDER — VANCOMYCIN HCL 1750 MG/350ML IV SOLN
1750.0000 mg | INTRAVENOUS | Status: DC
  Administered 2021-02-05 – 2021-02-06 (×2): 1750 mg via INTRAVENOUS
  Filled 2021-02-05 (×3): qty 350

## 2021-02-05 MED ORDER — ACETAMINOPHEN 325 MG PO TABS
ORAL_TABLET | ORAL | Status: AC
  Filled 2021-02-05: qty 3

## 2021-02-05 MED ORDER — DIPHENOXYLATE-ATROPINE 2.5-0.025 MG PO TABS: 2.0000 | ORAL_TABLET | Freq: Every day | ORAL | Status: DC | PRN

## 2021-02-05 MED ORDER — ACETAMINOPHEN 500 MG PO TABS: 1000.0000 mg | ORAL_TABLET | Freq: Once | ORAL | Status: DC

## 2021-02-05 MED ORDER — ACETAMINOPHEN 325 MG PO TABS
650.0000 mg | ORAL_TABLET | ORAL | Status: DC | PRN
  Administered 2021-02-05 – 2021-02-06 (×3): 650 mg via ORAL
  Filled 2021-02-05 (×3): qty 2

## 2021-02-05 MED ORDER — PRENATAL MULTIVITAMIN CH
1.0000 | ORAL_TABLET | Freq: Every day | ORAL | Status: DC
  Administered 2021-02-06 – 2021-02-07 (×2): 1 via ORAL
  Filled 2021-02-05 (×2): qty 1

## 2021-02-05 MED ORDER — VANCOMYCIN HCL 1500 MG/300ML IV SOLN
1500.0000 mg | Freq: Once | INTRAVENOUS | Status: DC
  Filled 2021-02-05: qty 300

## 2021-02-05 MED ORDER — SODIUM CHLORIDE 0.9 % IV SOLN
2.0000 g | INTRAVENOUS | Status: DC
  Administered 2021-02-05 – 2021-02-06 (×2): 2 g via INTRAVENOUS
  Filled 2021-02-05: qty 20
  Filled 2021-02-05: qty 2
  Filled 2021-02-05: qty 20

## 2021-02-05 MED ORDER — ACETAMINOPHEN 325 MG PO TABS
325.0000 mg | ORAL_TABLET | Freq: Once | ORAL | Status: AC
  Administered 2021-02-05: 325 mg via ORAL
  Filled 2021-02-05: qty 1

## 2021-02-05 MED ORDER — LACTATED RINGERS IV BOLUS
1000.0000 mL | Freq: Once | INTRAVENOUS | Status: AC
  Administered 2021-02-05: 1000 mL via INTRAVENOUS

## 2021-02-05 MED ORDER — ACETAMINOPHEN 325 MG PO TABS
975.0000 mg | ORAL_TABLET | Freq: Once | ORAL | Status: AC
  Administered 2021-02-05: 975 mg via ORAL

## 2021-02-05 MED ORDER — ZOLPIDEM TARTRATE 5 MG PO TABS: 5.0000 mg | ORAL_TABLET | Freq: Every evening | ORAL | Status: DC | PRN

## 2021-02-05 MED ORDER — DOCUSATE SODIUM 100 MG PO CAPS
100.0000 mg | ORAL_CAPSULE | Freq: Every day | ORAL | Status: DC
  Administered 2021-02-06: 100 mg via ORAL
  Filled 2021-02-05: qty 1

## 2021-02-05 MED ORDER — SODIUM CHLORIDE 0.9 % IV BOLUS
1000.0000 mL | Freq: Once | INTRAVENOUS | Status: AC
  Administered 2021-02-05: 1000 mL via INTRAVENOUS

## 2021-02-05 MED ORDER — FLUCONAZOLE 150 MG PO TABS
150.0000 mg | ORAL_TABLET | Freq: Once | ORAL | Status: AC
  Administered 2021-02-05: 150 mg via ORAL
  Filled 2021-02-05: qty 1

## 2021-02-05 MED ORDER — CALCIUM CARBONATE ANTACID 500 MG PO CHEW: 2.0000 | CHEWABLE_TABLET | ORAL | Status: DC | PRN

## 2021-02-05 MED ORDER — LACTATED RINGERS IV SOLN: INTRAVENOUS | Status: DC

## 2021-02-05 MED ORDER — GENTAMICIN SULFATE 40 MG/ML IJ SOLN
5.0000 mg/kg | INTRAVENOUS | Status: DC
  Administered 2021-02-05: 360 mg via INTRAVENOUS
  Filled 2021-02-05: qty 9

## 2021-02-05 MED ORDER — CLINDAMYCIN PHOSPHATE 900 MG/50ML IV SOLN
900.0000 mg | INTRAVENOUS | Status: AC
  Administered 2021-02-05: 900 mg via INTRAVENOUS
  Filled 2021-02-05: qty 50

## 2021-02-05 NOTE — MAU Note (Addendum)
Pain in left breast started yesterday, tender to touch, slight redness.  Fever starter yesterday, was taking Tylenol at home.  Highest test was 105 this morning.  Was 104 at Urgent Care.  UC started and IV and gave her 975mg  Tylenol at ~ 1415. Sent by EMS  To MAU possible sepsis.  Pt had started getting dizzy. Is feeling better now since got IV and Tylenol.  Vag delivery on 3/26

## 2021-02-05 NOTE — Progress Notes (Signed)
Pharmacy Antibiotic Note  Carol Lopez is a 20 y.o. female admitted on 02/05/2021 with breast pain, fever, headache, abdominal pain and tachycardia.  Pharmacy has been consulted for Vancomycin dosing for mastitis and r/o sepsis. Pt is s/p vaginal delivery on 3/26. Pt was treated for chorioamnionitis during delivery/postpartum.  Plan: Vancomycin 1750mg  IV q24h based on AUC dosing.  Will assess need for levels based on clinical status.  Height: 5\' 3"  (160 cm) Weight: 72.6 kg (160 lb) IBW/kg (Calculated) : 52.4  Temp (24hrs), Avg:101.5 F (38.6 C), Min:98.6 F (37 C), Max:104.5 F (40.3 C)  Recent Labs  Lab 02/05/21 1556 02/05/21 1846  WBC 12.5*  --   CREATININE  --  0.96  LATICACIDVEN 1.1  --     Estimated Creatinine Clearance: 89.3 mL/min (by C-G formula based on SCr of 0.96 mg/dL).    No Known Allergies  Antimicrobials this admission: Ampicillin 2 gram IV x 1   4/22 Gent 5mg /kg IV x 1  4/22 Clindamycin 900mg  IV x 1   4/22 Ceftriaxone 2 gram IV q24h  4/22 >> (sepsis order set for cellulitis)  Microbiology results: 4/22 BCx:          BCx:    Thank you for allowing pharmacy to be a part of this patient's care.  02/05/2021 7:53 PM

## 2021-02-05 NOTE — H&P (Signed)
Carol Lopez is a 20 y.o. female presenting  To MAU via EMS due to fever 105 this AM and left breast tenderness. She had a temp of 104 at urgent care and was sent to MAU via EMS for sepsis protocol. Pt reports erythema and tenderness of the left breast that started 2 days ago. She also has a headache. She reports fevers and chills.  She is breastfeeding. She delivered vaginally 01/09/2021.   . OB History    Gravida  1   Para  1   Term  1   Preterm      AB      Living  1     SAB      IAB      Ectopic      Multiple  0   Live Births  1          Past Medical History:  Diagnosis Date  . Medical history non-contributory    Past Surgical History:  Procedure Laterality Date  . NO PAST SURGERIES     Family History: family history includes Diabetes in her maternal grandmother and mother; Healthy in her father. Social History:  reports that she has never smoked. She has never used smokeless tobacco. She reports that she does not drink alcohol and does not use drugs.     Review of Systems  Constitutional: Positive for chills, fatigue and fever.  HENT: Negative.   Eyes: Negative.   Respiratory: Negative.   Cardiovascular: Negative.   Gastrointestinal: Negative.   Endocrine: Negative.   Genitourinary: Negative.   Musculoskeletal: Negative.   Skin: Positive for color change.       Erythema of the left breast   Allergic/Immunologic: Negative.   Neurological: Positive for headaches.  Hematological: Negative.   Psychiatric/Behavioral: Negative.    History   Blood pressure (!) 96/49, pulse (!) 115, temperature (!) 100.4 F (38 C), temperature source Oral, resp. rate 20, height 5\' 3"  (1.6 m), weight 72.6 kg, SpO2 100 %, currently breastfeeding. Exam Physical Exam Vitals reviewed.  Constitutional:      Appearance: Normal appearance.  HENT:     Head: Normocephalic and atraumatic.     Nose: Nose normal.     Mouth/Throat:     Mouth: Mucous membranes are moist.   Eyes:     Pupils: Pupils are equal, round, and reactive to light.  Cardiovascular:     Rate and Rhythm: Regular rhythm. Tachycardia present.     Pulses: Normal pulses.     Heart sounds: Normal heart sounds.  Pulmonary:     Effort: Pulmonary effort is normal.     Breath sounds: Normal breath sounds.  Abdominal:     General: Abdomen is flat. Bowel sounds are normal. There is no distension.     Palpations: Abdomen is soft.     Tenderness: There is no abdominal tenderness.  Musculoskeletal:        General: No swelling or tenderness. Normal range of motion.     Cervical back: Normal range of motion and neck supple.  Skin:    General: Skin is warm and dry.     Findings: Erythema present.     Comments: Erythema of the left upper breast noted. No evidence of abscess.. tender to palpation   Neurological:     General: No focal deficit present.     Mental Status: She is alert and oriented to person, place, and time.  Psychiatric:        Mood and Affect:  Mood normal.        Behavior: Behavior normal.     Prenatal labs: ABO, Rh: --/--/B POS (03/26 0030) Antibody: NEG (03/26 0030) Rubella: Immune (09/10 0000) RPR: NON REACTIVE (03/26 0037)  HBsAg: Negative (09/10 0000)  HIV: Non-reactive (09/10 0000)  GBS: Negative/-- (03/07 0000)  Results for orders placed or performed during the hospital encounter of 02/05/21 (from the past 24 hour(s))  Resp Panel by RT-PCR (Flu A&B, Covid) Nasopharyngeal Swab     Status: None   Collection Time: 02/05/21  3:48 PM   Specimen: Nasopharyngeal Swab; Nasopharyngeal(NP) swabs in vial transport medium  Result Value Ref Range   SARS Coronavirus 2 by RT PCR NEGATIVE NEGATIVE   Influenza A by PCR NEGATIVE NEGATIVE   Influenza B by PCR NEGATIVE NEGATIVE  CBC with Differential/Platelet     Status: Abnormal   Collection Time: 02/05/21  3:56 PM  Result Value Ref Range   WBC 12.5 (H) 4.0 - 10.5 K/uL   RBC 3.87 3.87 - 5.11 MIL/uL   Hemoglobin 9.5 (L) 12.0 -  15.0 g/dL   HCT 93.7 (L) 16.9 - 67.8 %   MCV 77.5 (L) 80.0 - 100.0 fL   MCH 24.5 (L) 26.0 - 34.0 pg   MCHC 31.7 30.0 - 36.0 g/dL   RDW 93.8 (H) 10.1 - 75.1 %   Platelets 222 150 - 400 K/uL   nRBC 0.0 0.0 - 0.2 %   Neutrophils Relative % 88 %   Neutro Abs 11.1 (H) 1.7 - 7.7 K/uL   Lymphocytes Relative 7 %   Lymphs Abs 0.9 0.7 - 4.0 K/uL   Monocytes Relative 4 %   Monocytes Absolute 0.5 0.1 - 1.0 K/uL   Eosinophils Relative 0 %   Eosinophils Absolute 0.0 0.0 - 0.5 K/uL   Basophils Relative 0 %   Basophils Absolute 0.0 0.0 - 0.1 K/uL   Immature Granulocytes 1 %   Abs Immature Granulocytes 0.06 0.00 - 0.07 K/uL  Lactic acid, plasma     Status: None   Collection Time: 02/05/21  3:56 PM  Result Value Ref Range   Lactic Acid, Venous 1.1 0.5 - 1.9 mmol/L  Creatinine, serum     Status: None   Collection Time: 02/05/21  6:46 PM  Result Value Ref Range   Creatinine, Ser 0.96 0.44 - 1.00 mg/dL   GFR, Estimated >02 >58 mL/min   Assessment/Plan: Mastitis Admit to rule out sepsis.  - vancomycin / rocephen IV - continue inpatient management until afebrile for 24 hours and clinical improvement is noted.  -CBC in am  -pt to pump every 3 hours  -will monitor closely    Gerald Leitz 02/05/2021, 10:48 PM

## 2021-02-05 NOTE — ED Triage Notes (Addendum)
Pt in with c/o headache that started yesterday and fever . Pt states her fever was 105 before she came into UC  Pt took tylenol for sx  Pt also c/o left breast pain that has been going on for a few days with and w/o breastfeeding.

## 2021-02-05 NOTE — ED Provider Notes (Signed)
[facility]    CSN: [id number] Arrival date & time: 02/05/21  1259      History   Chief Complaint Chief Complaint  Patient presents with  . Headache  . Chills  . Fever    HPI Carol Lopez is a 20 y.o. female presenting with fevers as high as 105, chills, headaches for 1 day. This patient had a vaginal birth 3 weeks ago (01/09/2021).  Today with 4/10 lower abdominal pain. Left breast pain x3 days, decreased milk supply (7oz to now 3oz). Pt is pumping but not breastfeeding. Fevers as high as 105 at home associated with chills.  Has taken Tylenol for fever reduction, but last dose of this was 12 hours ago.  Endorses generalized weakness.  1 episode of dizziness 1 day ago but this has resolved since then.  Throbbing headaches.  Denies focal weakness, vision changes, worst headache of life, chest pain, shortness of breath.denies vaginal discharge, dyrusia, urinary frequency, vaginal spotting, cough, congestion.  HPI  Past Medical History:  Diagnosis Date  . Medical history non-contributory     Patient Active Problem List   Diagnosis Date Noted  . Fever of unknown origin following delivery, postpartum 02/05/2021  . Term pregnancy 01/09/2021    Past Surgical History:  Procedure Laterality Date  . NO PAST SURGERIES      OB History    Gravida  1   Para  1   Term  1   Preterm      AB      Living  1     SAB      IAB      Ectopic      Multiple  0   Live Births  1            Home Medications    Prior to Admission medications   Medication Sig Start Date End Date Taking? Authorizing Provider  acetaminophen (TYLENOL) 500 MG tablet Take 1,000 mg by mouth every 6 (six) hours as needed for fever or moderate pain.    [provider]  ferrous sulfate 325 (65 FE) MG tablet Take 1 tablet (325 mg total) by mouth daily with breakfast. 01/11/21   Gerald Leitz, MD  ibuprofen (ADVIL) 600 MG tablet Take 1 tablet (600 mg total) by mouth every 6 (six)  hours as needed. 01/10/21   Gerald Leitz, MD  Prenatal Vit-Fe Fumarate-FA (PRENATAL VITAMIN) 27-0.8 MG TABS Take by mouth.    [provider]    Family History Family History  Problem Relation Age of Onset  . Diabetes Mother   . Diabetes Maternal Grandmother   . Healthy Father     Social History Social History   Tobacco Use  . Smoking status: Never Smoker  . Smokeless tobacco: Never Used  Vaping Use  . Vaping Use: Never used  Substance Use Topics  . Alcohol use: Never  . Drug use: Never     Allergies   Patient has no known allergies.   Review of Systems Review of Systems  Constitutional: Positive for chills and fever. Negative for appetite change.  HENT: Negative for congestion, ear pain, rhinorrhea, sinus pressure, sinus pain and sore throat.   Eyes: Negative for redness and visual disturbance.  Respiratory: Negative for cough, chest tightness, shortness of breath and wheezing.   Cardiovascular: Negative for chest pain and palpitations.  Gastrointestinal: Positive for abdominal pain. Negative for constipation, diarrhea, nausea and vomiting.  Genitourinary: Negative for dysuria, frequency and urgency.  Musculoskeletal:  Negative for myalgias.  Neurological: Positive for weakness and headaches. Negative for dizziness.  Psychiatric/Behavioral: Negative for confusion.  All other systems reviewed and are negative.    Physical Exam Triage Vital Signs ED Triage Vitals  Enc Vitals Group     BP 02/05/21 1331 (!) 107/51     Pulse Rate 02/05/21 1331 (!) 147     Resp 02/05/21 1331 (!) 21     Temp 02/05/21 1331 (!) 104.5 F (40.3 C)     Temp src --      SpO2 02/05/21 1331 95 %     Weight --      Height --      Head Circumference --      Peak Flow --      Pain Score 02/05/21 1330 7     Pain Loc --      Pain Edu? --      Excl. in GC? --    No data found.  Updated Vital Signs BP (!) 81/47 (BP Location: Right Arm)   Pulse (!) 140   Temp (!) 102.7 F (39.3  C) (Oral)   Resp 20   SpO2 95%   Breastfeeding Yes   Visual Acuity Right Eye Distance:   Left Eye Distance:   Bilateral Distance:    Right Eye Near:   Left Eye Near:    Bilateral Near:     Physical Exam Vitals reviewed. Exam conducted with a chaperone present.  Constitutional:      General: She is not in acute distress.    Appearance: Normal appearance. She is ill-appearing and diaphoretic.  HENT:     Head: Normocephalic and atraumatic.     Mouth/Throat:     Mouth: Mucous membranes are moist.     Comments: Moist mucous membranes Eyes:     Extraocular Movements: Extraocular movements intact.     Pupils: Pupils are equal, round, and reactive to light.  Cardiovascular:     Rate and Rhythm: Regular rhythm. Tachycardia present.     Heart sounds: Normal heart sounds.  Pulmonary:     Effort: Pulmonary effort is normal.     Breath sounds: Normal breath sounds. No wheezing, rhonchi or rales.  Chest:     Comments: Chaperone Dr. Leonides Grills.  L breast diffusely erythematous and warm, without mass or abscess. No nipple discharge able to be expressed. Abdominal:     General: Bowel sounds are normal. There is no distension.     Palpations: Abdomen is soft. There is no mass.     Tenderness: There is abdominal tenderness in the suprapubic area. There is no right CVA tenderness, left CVA tenderness, guarding or rebound.  Skin:    General: Skin is warm.     Capillary Refill: Capillary refill takes less than 2 seconds.     Comments: Good skin turgor  Neurological:     General: No focal deficit present.     Mental Status: She is alert and oriented to person, place, and time.  Psychiatric:        Mood and Affect: Mood normal.        Behavior: Behavior normal.      UC Treatments / Results  Labs (all labs ordered are listed, but only abnormal results are displayed) Labs Reviewed - No data to display  EKG   Radiology No results found.  Procedures Procedures (including critical  care time)  Medications Ordered in UC Medications  acetaminophen (TYLENOL) tablet 975 mg (975 mg Oral Given 02/05/21  1342)  sodium chloride 0.9 % bolus 1,000 mL (1,000 mLs Intravenous New Bag/Given 02/05/21 1443)    Initial Impression / Assessment and Plan / UC Course  I have reviewed the triage vital signs and the nursing notes.  Pertinent labs & imaging results that were available during my care of the patient were reviewed by me and considered in my medical decision making (see chart for details).     This patient is a 20 year old female presenting with septic shock due to mastitis. 3 weeks postpartum- vaginal delivery.  Her temperature is currently 104.5, pulse 147.  Last dose of antipyretic was 12 hours ago, so tylenol 975mg  administered during this visit with reduction of fever to 102.7. BP initially 107/51, is now 81/47. EKG sinus tachycardia but otherwise wnl.  I am concerned for sepsis given vital signs and presentation. Possibly related to mastitis. I am sending her to ED via EMS for sepsis workup and likely admission. Coding this a Level 5 as her condition is life threatening and she was admitted to hospital. Treatment plan reviewed with attending physician Dr. who is in agreement with this plan.  Final Clinical Impressions(s) / UC Diagnoses   Final diagnoses:  Mastitis, left, acute  Postpartum breast pain  Septic shock [facility])   Discharge Instructions   None    ED Prescriptions    None     PDMP not reviewed this encounter.   [facility], PA-C 02/06/21 1024

## 2021-02-05 NOTE — MAU Provider Note (Signed)
History     CSN: [id number]  Arrival date and time: 02/05/21 1503  Event Date/Time   First Provider Initiated Contact with Patient 02/05/21 1530      Chief Complaint  Patient presents with  . Fever  . Breast Pain   HPI Carol Lopez is a 20 y.o. G1P1001 postpartum patient who presents to MAU via EMS from Urgent Care for Sepsis Evaluation. Patient is s/p vaginal delivery on 01/09/2021. She was treated for Triple I during her intrapartum care. She is exclusively pumping. She endorses new onset breast pain three days ago. She reports new onset headache and oral temp of 105 late last night/early this morning. She also experienced new onset fatigue, dizziness and abdominal pain. She attempted management with Tylenol but did not experience relief. She sought evaluation at Urgent Care earlier today.   Patient endorses ongoing breast pain to Urgent Care but denies pain on arrival to MAU. She is exclusively pumping. She denies breast pain, discoloration, tenderness, purulent nipple discharge.   On arrival to MAU patient endorses feeling "much better" s/p 975mg  Tylenol at Urgent Care. She denies headache, myalgia, weakness, dizziness.  Patient receives care with [facility]. Dr. if her primary. Patient states she has not scheduled her postpartum appointment.   OB History    Gravida  1   Para  1   Term  1   Preterm      AB      Living  1     SAB      IAB      Ectopic      Multiple  0   Live Births  1           Past Medical History:  Diagnosis Date  . Medical history non-contributory     Past Surgical History:  Procedure Laterality Date  . NO PAST SURGERIES      Family History  Problem Relation Age of Onset  . Diabetes Mother   . Diabetes Maternal Grandmother   . Healthy Father     Social History   Tobacco Use  . Smoking status: Never Smoker  . Smokeless tobacco: Never Used  Vaping Use  . Vaping Use: Never used  Substance Use Topics  . Alcohol  use: Never  . Drug use: Never    Allergies: No Known Allergies  Medications Prior to Admission  Medication Sig Dispense Refill Last Dose  . acetaminophen (TYLENOL) 500 MG tablet Take 1,000 mg by mouth every 6 (six) hours as needed for fever or moderate pain.    at 0300  . ibuprofen (ADVIL) 600 MG tablet Take 1 tablet (600 mg total) by mouth every 6 (six) hours as needed. 30 tablet 1 Past Month at Unknown time  . Prenatal Vit-Fe Fumarate-FA (PRENATAL VITAMIN) 27-0.8 MG TABS Take by mouth.   02/04/2021 at Unknown time  . ferrous sulfate 325 (65 FE) MG tablet Take 1 tablet (325 mg total) by mouth daily with breakfast. 30 tablet 1     Review of Systems  Constitutional: Positive for fatigue and fever.  All other systems reviewed and are negative.  Physical Exam   Blood pressure 101/60, pulse (!) 113, temperature (!) 101.9 F (38.8 C), temperature source Oral, resp. rate (!) 28, SpO2 99 %, currently breastfeeding.  Physical Exam Vitals and nursing note reviewed. Exam conducted with a chaperone present.  Constitutional:      Appearance: Normal appearance.  Cardiovascular:     Rate and Rhythm: Normal rate.  Pulses: Normal pulses.     Heart sounds: Normal heart sounds.  Pulmonary:     Effort: Pulmonary effort is normal.     Breath sounds: Normal breath sounds.  Abdominal:     General: Abdomen is flat. There is no distension.     Tenderness: There is no right CVA tenderness or left CVA tenderness.  Skin:    Capillary Refill: Capillary refill takes less than 2 seconds.  Neurological:     Mental Status: She is alert and oriented to person, place, and time.  Psychiatric:        Mood and Affect: Mood normal.        Behavior: Behavior normal.        Thought Content: Thought content normal.        Judgment: Judgment normal.     MAU Course  Procedures  --Report of concern for sepsis receives from EMS crew.  --CNM at bedside on arrival due to sepsis criteria in chart from Urgent  Care  --Sepsis Protocol ordered on arrival --975 mg Tylenol given at Urgent Care --Discussed with Dr. Alysia Penna, Faculty Attending. Agrees with my recommendation for admission. Per Dr. Alysia Penna, will defer initiating abx as care will imminently be assumed by [facility] Physician  --Dr. Richardson Dopp notified at 1520. Report given. Verbal orders received for abx. Dr. Jerl Santos intention to place admission orders in 15-30 minutes. MAU RN notified, patient updated  Patient Vitals for the past 24 hrs:  BP Temp Temp src Pulse Resp SpO2 Weight  02/05/21 1559 -- -- -- -- -- -- 72.7 kg  02/05/21 1505 101/60 (!) 101.9 F (38.8 C) Oral (!) 113 (!) 28 99 % --  02/05/21 1503 -- -- -- -- -- 99 % --   Assessment and Plan  --Postpartum, lactating patient --Intermittent left breast pain, Mastitis --Sepsis evaluation --Per Dr. Richardson Dopp, admit to [facility], CNM 02/05/2021, 4:30 PM

## 2021-02-06 LAB — CBC
HCT: 28.2 % — ABNORMAL LOW (ref 36.0–46.0)
Hemoglobin: 9 g/dL — ABNORMAL LOW (ref 12.0–15.0)
MCH: 24.6 pg — ABNORMAL LOW (ref 26.0–34.0)
MCHC: 31.9 g/dL (ref 30.0–36.0)
MCV: 77 fL — ABNORMAL LOW (ref 80.0–100.0)
Platelets: 199 10*3/uL (ref 150–400)
RBC: 3.66 MIL/uL — ABNORMAL LOW (ref 3.87–5.11)
RDW: 16.2 % — ABNORMAL HIGH (ref 11.5–15.5)
WBC: 10.2 10*3/uL (ref 4.0–10.5)
nRBC: 0 % (ref 0.0–0.2)

## 2021-02-06 MED ORDER — SODIUM CHLORIDE 0.9 % IV SOLN: INTRAVENOUS | Status: DC | PRN

## 2021-02-06 NOTE — Progress Notes (Signed)
Subjective: Patient reports tolerating PO and no problems voiding.   Pain in left breast has improved. She is pumping every 3-4 hours. She feels better than yesterday.    Objective: I have reviewed patient's vital signs, intake and output, medications and labs.  General: alert, cooperative and no distress Resp: no distress  GI: soft, non-tender; bowel sounds normal; no masses,  no organomegaly Extremities: extremities normal, atraumatic, no cyanosis or edema  Skin: Erythema involving the left breast has improved.   Results for orders placed or performed during the hospital encounter of 02/05/21 (from the past 24 hour(s))  Creatinine, serum     Status: None   Collection Time: 02/05/21  6:46 PM  Result Value Ref Range   Creatinine, Ser 0.96 0.44 - 1.00 mg/dL   GFR, Estimated >12 >87 mL/min  Comprehensive metabolic panel     Status: Abnormal   Collection Time: 02/05/21 10:31 PM  Result Value Ref Range   Sodium 138 135 - 145 mmol/L   Potassium 3.4 (L) 3.5 - 5.1 mmol/L   Chloride 107 98 - 111 mmol/L   CO2 21 (L) 22 - 32 mmol/L   Glucose, Bld 97 70 - 99 mg/dL   BUN 8 6 - 20 mg/dL   Creatinine, Ser 8.67 0.44 - 1.00 mg/dL   Calcium 8.4 (L) 8.9 - 10.3 mg/dL   Total Protein 6.0 (L) 6.5 - 8.1 g/dL   Albumin 3.3 (L) 3.5 - 5.0 g/dL   AST 22 15 - 41 U/L   ALT 16 0 - 44 U/L   Alkaline Phosphatase 61 38 - 126 U/L   Total Bilirubin 0.8 0.3 - 1.2 mg/dL   GFR, Estimated >67 >20 mL/min   Anion gap 10 5 - 15  Protime-INR     Status: Abnormal   Collection Time: 02/05/21 10:31 PM  Result Value Ref Range   Prothrombin Time 16.4 (H) 11.4 - 15.2 seconds   INR 1.3 (H) 0.8 - 1.2  APTT     Status: None   Collection Time: 02/05/21 10:31 PM  Result Value Ref Range   aPTT 34 24 - 36 seconds  Lactic acid, plasma     Status: None   Collection Time: 02/05/21 10:31 PM  Result Value Ref Range   Lactic Acid, Venous 1.0 0.5 - 1.9 mmol/L  CBC     Status: Abnormal   Collection Time: 02/06/21  5:19 AM   Result Value Ref Range   WBC 10.2 4.0 - 10.5 K/uL   RBC 3.66 (L) 3.87 - 5.11 MIL/uL   Hemoglobin 9.0 (L) 12.0 - 15.0 g/dL   HCT 94.7 (L) 09.6 - 28.3 %   MCV 77.0 (L) 80.0 - 100.0 fL   MCH 24.6 (L) 26.0 - 34.0 pg   MCHC 31.9 30.0 - 36.0 g/dL   RDW 66.2 (H) 94.7 - 65.4 %   Platelets 199 150 - 400 K/uL   nRBC 0.0 0.0 - 0.2 %      Assessment/Plan: Mastitis- Improving clinically. Pt had a temp of 102.5 at 6am.. will continue vanc and rocephin until afebrile for 24 hours.  DVT prophylaxis- SCD's.. activity as tolerated.  leukocystosis - improving wbc count is trending downward.  Saline lock IV  possible discharge home tomorrow   LOS: 1 day    Gerald Leitz 02/06/2021, 4:16 PM

## 2021-02-07 MED ORDER — CEPHALEXIN 500 MG PO CAPS: 500.0000 mg | ORAL_CAPSULE | Freq: Four times a day (QID) | ORAL | 0 refills | Status: AC

## 2021-02-07 NOTE — Plan of Care (Signed)

## 2021-02-07 NOTE — Discharge Summary (Signed)
Physician Discharge Summary  Patient ID: Carol Lopez MRN: [id number] DOB/AGE: 20-09-2001 20 y.o.  Admit date: 02/05/2021 Discharge date: 02/07/2021  Admission Diagnoses:Mastitis   Discharge Diagnoses: Mastitis  Active Problems:   Fever of unknown origin following delivery, postpartum   Discharged Condition: good  Hospital Course: Pt was admitted to rule out sepsis and with diagnosis of mastitis. She had erythema and tenderness in her left breast. She was tachycardic and temp was 104.5 on arrival. She was started on amp gent and clinda empirically as the source of her fever was unknown and there was concern for sepsis. She did not have sepsis and upon dx of mastitis was switched to IV vancomycin and IV rocephin. With clinical improvement. Upon discharge on 02/07/2021 her last temp greater than 100.4 was on 02/06/2021 at 605 am . Tc is 98.9. pt states tenderness in left breast has totally resolved. She no longer has chills. She is discharge to start keflex 500 mg qid for 7 days.   Consults: None  Significant Diagnostic Studies: labs:  Results for orders placed or performed during the hospital encounter of 02/05/21 (from the past 72 hour(s))  Resp Panel by RT-PCR (Flu A&B, Covid) Nasopharyngeal Swab     Status: None   Collection Time: 02/05/21  3:48 PM   Specimen: Nasopharyngeal Swab; Nasopharyngeal(NP) swabs in vial transport medium  Result Value Ref Range   SARS Coronavirus 2 by RT PCR NEGATIVE NEGATIVE    Comment: (NOTE) SARS-CoV-2 target nucleic acids are NOT DETECTED.  The SARS-CoV-2 RNA is generally detectable in upper respiratory specimens during the acute phase of infection. The lowest concentration of SARS-CoV-2 viral copies this assay can detect is 138 copies/mL. A negative result does not preclude SARS-Cov-2 infection and should not be used as the sole basis for treatment or other patient management decisions. A negative result may occur with  improper specimen  collection/handling, submission of specimen other than nasopharyngeal swab, presence of viral mutation(s) within the areas targeted by this assay, and inadequate number of viral copies(<138 copies/mL). A negative result must be combined with clinical observations, patient history, and epidemiological information. The expected result is Negative.  Fact Sheet for Patients:  BloggerCourse.com  Fact Sheet for Healthcare Providers:  SeriousBroker.it  This test is no t yet approved or cleared by the [place] FDA and  has been authorized for detection and/or diagnosis of SARS-CoV-2 by FDA under an Emergency Use Authorization (EUA). This EUA will remain  in effect (meaning this test can be used) for the duration of the COVID-19 declaration under Section 564(b)(1) of the Act, 21 U.S.C.section 360bbb-3(b)(1), unless the authorization is terminated  or revoked sooner.       Influenza A by PCR NEGATIVE NEGATIVE   Influenza B by PCR NEGATIVE NEGATIVE    Comment: (NOTE) The Xpert Xpress SARS-CoV-2/FLU/RSV plus assay is intended as an aid in the diagnosis of influenza from Nasopharyngeal swab specimens and should not be used as a sole basis for treatment. Nasal washings and aspirates are unacceptable for Xpert Xpress SARS-CoV-2/FLU/RSV testing.  Fact Sheet for Patients: BloggerCourse.com  Fact Sheet for Healthcare Providers: SeriousBroker.it  This test is not yet approved or cleared by the [place] FDA and has been authorized for detection and/or diagnosis of SARS-CoV-2 by FDA under an Emergency Use Authorization (EUA). This EUA will remain in effect (meaning this test can be used) for the duration of the COVID-19 declaration under Section 564(b)(1) of the Act, 21 U.S.C. section 360bbb-3(b)(1), unless the authorization is  terminated or revoked.  Performed at [facility]  Lab, [address] [place], [place] [address]   CBC with Differential/Platelet     Status: Abnormal   Collection Time: 02/05/21  3:56 PM  Result Value Ref Range   WBC 12.5 (H) 4.0 - 10.5 K/uL   RBC 3.87 3.87 - 5.11 MIL/uL   Hemoglobin 9.5 (L) 12.0 - 15.0 g/dL   HCT 32.5 (L) 49.8 - 26.4 %   MCV 77.5 (L) 80.0 - 100.0 fL   MCH 24.5 (L) 26.0 - 34.0 pg   MCHC 31.7 30.0 - 36.0 g/dL   RDW 15.8 (H) 30.9 - 40.7 %   Platelets 222 150 - 400 K/uL   nRBC 0.0 0.0 - 0.2 %   Neutrophils Relative % 88 %   Neutro Abs 11.1 (H) 1.7 - 7.7 K/uL   Lymphocytes Relative 7 %   Lymphs Abs 0.9 0.7 - 4.0 K/uL   Monocytes Relative 4 %   Monocytes Absolute 0.5 0.1 - 1.0 K/uL   Eosinophils Relative 0 %   Eosinophils Absolute 0.0 0.0 - 0.5 K/uL   Basophils Relative 0 %   Basophils Absolute 0.0 0.0 - 0.1 K/uL   Immature Granulocytes 1 %   Abs Immature Granulocytes 0.06 0.00 - 0.07 K/uL    Comment: Performed at [facility], [address] [place], [place] [address]  Lactic acid, plasma     Status: None   Collection Time: 02/05/21  3:56 PM  Result Value Ref Range   Lactic Acid, Venous 1.1 0.5 - 1.9 mmol/L    Comment: Performed at [facility], [address], [place], [place] [address]  Culture, blood (Routine X 2) w Reflex to ID Panel     Status: None (Preliminary result)   Collection Time: 02/05/21  3:56 PM   Specimen: BLOOD  Result Value Ref Range   Specimen Description BLOOD BLOOD LEFT FOREARM    Special Requests      BOTTLES DRAWN AEROBIC AND ANAEROBIC Blood Culture results may not be optimal due to an inadequate volume of blood received in culture bottles   Culture      NO GROWTH 2 DAYS Performed at [facility], [address], [place], [place] [address]    Report Status PENDING   Culture, blood (Routine X 2) w Reflex to ID Panel     Status: None (Preliminary result)   Collection Time: 02/05/21  4:09 PM   Specimen: BLOOD LEFT HAND  Result Value Ref Range   Specimen Description  BLOOD LEFT HAND    Special Requests      BOTTLES DRAWN AEROBIC AND ANAEROBIC Blood Culture results may not be optimal due to an inadequate volume of blood received in culture bottles   Culture      NO GROWTH 2 DAYS Performed at [facility], [address], [place], [place] [address]    Report Status PENDING   Creatinine, serum     Status: None   Collection Time: 02/05/21  6:46 PM  Result Value Ref Range   Creatinine, Ser 0.96 0.44 - 1.00 mg/dL   GFR, Estimated >44 >62 mL/min    Comment: (NOTE) Calculated using the CKD-EPI Creatinine Equation (2021) Performed at [facility], [address] [place], [place] [address]   Comprehensive metabolic panel     Status: Abnormal   Collection Time: 02/05/21 10:31 PM  Result Value Ref Range   Sodium 138 135 -  145 mmol/L   Potassium 3.4 (L) 3.5 - 5.1 mmol/L   Chloride 107 98 - 111 mmol/L   CO2 21 (L) 22 - 32 mmol/L   Glucose, Bld 97 70 - 99 mg/dL    Comment: Glucose reference range applies only to samples taken after fasting for at least 8 hours.   BUN 8 6 - 20 mg/dL   Creatinine, Ser 8.290.95 0.44 - 1.00 mg/dL   Calcium 8.4 (L) 8.9 - 10.3 mg/dL   Total Protein 6.0 (L) 6.5 - 8.1 g/dL   Albumin 3.3 (L) 3.5 - 5.0 g/dL   AST 22 15 - 41 U/L   ALT 16 0 - 44 U/L   Alkaline Phosphatase 61 38 - 126 U/L   Total Bilirubin 0.8 0.3 - 1.2 mg/dL   GFR, Estimated >56>60 >21>60 mL/min    Comment: (NOTE) Calculated using the CKD-EPI Creatinine Equation (2021)    Anion gap 10 5 - 15    Comment: Performed at [facility], [address] [place], [place] [address]  Protime-INR     Status: Abnormal   Collection Time: 02/05/21 10:31 PM  Result Value Ref Range   Prothrombin Time 16.4 (H) 11.4 - 15.2 seconds   INR 1.3 (H) 0.8 - 1.2    Comment: (NOTE) INR goal varies based on device and disease states. Performed at [facility], [address] [place], [place] [address]   APTT     Status: None   Collection Time: 02/05/21 10:31 PM   Result Value Ref Range   aPTT 34 24 - 36 seconds    Comment: Performed at [facility], [address] [place], [place] [address]  Lactic acid, plasma     Status: None   Collection Time: 02/05/21 10:31 PM  Result Value Ref Range   Lactic Acid, Venous 1.0 0.5 - 1.9 mmol/L    Comment: Performed at [facility], [address] [place], [place] [address]  CBC     Status: Abnormal   Collection Time: 02/06/21  5:19 AM  Result Value Ref Range   WBC 10.2 4.0 - 10.5 K/uL   RBC 3.66 (L) 3.87 - 5.11 MIL/uL   Hemoglobin 9.0 (L) 12.0 - 15.0 g/dL   HCT 44.028.2 (L) 10.236.0 - 72.546.0 %   MCV 77.0 (L) 80.0 - 100.0 fL   MCH 24.6 (L) 26.0 - 34.0 pg   MCHC 31.9 30.0 - 36.0 g/dL   RDW 36.616.2 (H) 44.011.5 - 34.715.5 %   Platelets 199 150 - 400 K/uL   nRBC 0.0 0.0 - 0.2 %    Comment: Performed at [facility]  Lab, [address] [place], [place] [address]    Treatments: antibiotics: vancomycin and ceftriaxone  Discharge Exam: Blood pressure 107/69, pulse 78, temperature 98.2 F (36.8 C), temperature source Oral, resp. rate 18, height 5\' 3"  (1.6 m), weight 72.6 kg, SpO2 100 %, currently breastfeeding. General appearance: alert, cooperative and no distress Breasts: normal appearance, no masses or tenderness GI: soft, non-tender; bowel sounds normal; no masses,  no organomegaly Extremities: extremities normal, atraumatic, no cyanosis or edema  Disposition: Discharge disposition: 01-Home or Self Care       Discharge Instructions    Activity as tolerated   Complete by: As directed    Call MD for:  difficulty breathing, headache or visual disturbances   Complete by: As directed    Call MD for:  persistant nausea and vomiting   Complete by:  As directed    Call MD for:  severe uncontrolled pain   Complete by: As directed    Call MD for:  temperature >100.4   Complete by: As directed    Diet general   Complete by: As directed    Sexual activity   Complete by: As directed    Avoid sex until 6  weeks post delivery of your newborn     Allergies as of 02/07/2021   No Known Allergies     Medication List    TAKE these medications   acetaminophen 500 MG tablet Commonly known as: TYLENOL Take 1,000 mg by mouth every 6 (six) hours as needed for fever or moderate pain.   cephALEXin 500 MG capsule Commonly known as: Keflex Take 1 capsule (500 mg total) by mouth 4 (four) times daily for 7 days.   ferrous sulfate 325 (65 FE) MG tablet Take 1 tablet (325 mg total) by mouth daily with breakfast.   ibuprofen 600 MG tablet Commonly known as: ADVIL Take 1 tablet (600 mg total) by mouth every 6 (six) hours as needed.   Prenatal Vitamin 27-0.8 MG Tabs Take by mouth.       Follow-up Information    Gerald Leitz, MD. Schedule an appointment as soon as possible for a visit in 2 week(s).   Specialty: Obstetrics and Gynecology Why: please make an appointment for postpartum visit and follow up of mastitis in 2 weeks  Contact information: [address] [place] [address] [phone]               Signed: Gerald Leitz 02/07/2021, 11:56 AM

## 2021-02-10 LAB — CULTURE, BLOOD (ROUTINE X 2)
Culture: NO GROWTH
Culture: NO GROWTH

## 2022-09-13 DIAGNOSIS — Z20828 Contact with and (suspected) exposure to other viral communicable diseases: Secondary | ICD-10-CM | POA: Diagnosis not present

## 2022-09-13 DIAGNOSIS — Z6830 Body mass index (BMI) 30.0-30.9, adult: Secondary | ICD-10-CM | POA: Diagnosis not present

## 2022-09-13 DIAGNOSIS — J00 Acute nasopharyngitis [common cold]: Secondary | ICD-10-CM | POA: Diagnosis not present

## 2022-09-26 ENCOUNTER — Other Ambulatory Visit: Payer: Self-pay | Admitting: Obstetrics and Gynecology

## 2022-09-26 ENCOUNTER — Other Ambulatory Visit (HOSPITAL_COMMUNITY)
Admission: RE | Admit: 2022-09-26 | Discharge: 2022-09-26 | Disposition: A | Discharge status: Home / Self Care | Payer: Federal, State, Local not specified - PPO | Source: Ambulatory Visit | Attending: Obstetrics and Gynecology | Admitting: Obstetrics and Gynecology

## 2022-09-26 DIAGNOSIS — Z01419 Encounter for gynecological examination (general) (routine) without abnormal findings: Secondary | ICD-10-CM | POA: Diagnosis not present

## 2022-09-26 DIAGNOSIS — Z113 Encounter for screening for infections with a predominantly sexual mode of transmission: Secondary | ICD-10-CM | POA: Diagnosis not present

## 2022-09-28 LAB — CYTOLOGY - PAP: Diagnosis: NEGATIVE

## 2022-12-06 DIAGNOSIS — K080 Exfoliation of teeth due to systemic causes: Secondary | ICD-10-CM | POA: Diagnosis not present

## 2023-01-30 DIAGNOSIS — Z20828 Contact with and (suspected) exposure to other viral communicable diseases: Secondary | ICD-10-CM | POA: Diagnosis not present

## 2023-01-30 DIAGNOSIS — Z6832 Body mass index (BMI) 32.0-32.9, adult: Secondary | ICD-10-CM | POA: Diagnosis not present

## 2023-01-30 DIAGNOSIS — U071 COVID-19: Secondary | ICD-10-CM | POA: Diagnosis not present

## 2023-08-06 ENCOUNTER — Emergency Department (HOSPITAL_COMMUNITY)
Admission: EM | Admit: 2023-08-06 | Discharge: 2023-08-07 | Discharge status: Against Medical Advice | Payer: Federal, State, Local not specified - PPO | Attending: Emergency Medicine | Admitting: Emergency Medicine

## 2023-08-06 ENCOUNTER — Other Ambulatory Visit: Payer: Self-pay

## 2023-08-06 ENCOUNTER — Encounter (HOSPITAL_COMMUNITY): Payer: Self-pay | Admitting: Emergency Medicine

## 2023-08-06 DIAGNOSIS — M545 Low back pain, unspecified: Secondary | ICD-10-CM | POA: Diagnosis not present

## 2023-08-06 DIAGNOSIS — Z1152 Encounter for screening for COVID-19: Secondary | ICD-10-CM | POA: Insufficient documentation

## 2023-08-06 DIAGNOSIS — N898 Other specified noninflammatory disorders of vagina: Secondary | ICD-10-CM | POA: Diagnosis not present

## 2023-08-06 DIAGNOSIS — R110 Nausea: Secondary | ICD-10-CM | POA: Insufficient documentation

## 2023-08-06 DIAGNOSIS — Z5321 Procedure and treatment not carried out due to patient leaving prior to being seen by health care provider: Secondary | ICD-10-CM | POA: Diagnosis not present

## 2023-08-06 DIAGNOSIS — R1030 Lower abdominal pain, unspecified: Secondary | ICD-10-CM | POA: Insufficient documentation

## 2023-08-06 LAB — COMPREHENSIVE METABOLIC PANEL
ALT: 15 U/L (ref 0–44)
AST: 18 U/L (ref 15–41)
Albumin: 4.6 g/dL (ref 3.5–5.0)
Alkaline Phosphatase: 54 U/L (ref 38–126)
Anion gap: 7 (ref 5–15)
BUN: 10 mg/dL (ref 6–20)
CO2: 27 mmol/L (ref 22–32)
Calcium: 9.6 mg/dL (ref 8.9–10.3)
Chloride: 106 mmol/L (ref 98–111)
Creatinine, Ser: 0.78 mg/dL (ref 0.44–1.00)
GFR, Estimated: >60 mL/min (ref >60)
Glucose, Bld: 117 mg/dL — ABNORMAL HIGH (ref 70–99)
Potassium: 4.1 mmol/L (ref 3.5–5.1)
Sodium: 140 mmol/L (ref 135–145)
Total Bilirubin: 0.7 mg/dL (ref 0.3–1.2)
Total Protein: 8.2 g/dL — ABNORMAL HIGH (ref 6.5–8.1)

## 2023-08-06 LAB — URINALYSIS, ROUTINE W REFLEX MICROSCOPIC
Bacteria, UA: NONE SEEN
Bilirubin Urine: NEGATIVE
Glucose, UA: NEGATIVE mg/dL
Ketones, ur: NEGATIVE mg/dL
Nitrite: NEGATIVE
Protein, ur: NEGATIVE mg/dL
Specific Gravity, Urine: 1.011 (ref 1.005–1.030)
pH: 7 (ref 5.0–8.0)

## 2023-08-06 LAB — CBC
HCT: 39.8 % (ref 36.0–46.0)
Hemoglobin: 13.2 g/dL (ref 12.0–15.0)
MCH: 28.1 pg (ref 26.0–34.0)
MCHC: 33.2 g/dL (ref 30.0–36.0)
MCV: 84.9 fL (ref 80.0–100.0)
Platelets: 268 10*3/uL (ref 150–400)
RBC: 4.69 MIL/uL (ref 3.87–5.11)
RDW: 12.8 % (ref 11.5–15.5)
WBC: 10.1 10*3/uL (ref 4.0–10.5)
nRBC: 0 % (ref 0.0–0.2)

## 2023-08-06 LAB — HCG, SERUM, QUALITATIVE: Preg, Serum: NEGATIVE

## 2023-08-06 LAB — SARS CORONAVIRUS 2 BY RT PCR: SARS Coronavirus 2 by RT PCR: NEGATIVE

## 2023-08-06 LAB — LIPASE, BLOOD: Lipase: 43 U/L (ref 11–51)

## 2023-08-06 NOTE — ED Triage Notes (Signed)
Pt presents from home for lower abd pain and lower back pain. Positive nausea,  dark yellow vaginal discharge  Denies blood in stool or vomit, fever, urinary sx

## 2023-10-09 DIAGNOSIS — Z01419 Encounter for gynecological examination (general) (routine) without abnormal findings: Secondary | ICD-10-CM | POA: Diagnosis not present

## 2023-10-09 DIAGNOSIS — Z113 Encounter for screening for infections with a predominantly sexual mode of transmission: Secondary | ICD-10-CM | POA: Diagnosis not present

## 2023-10-18 NOTE — L&D Delivery Note (Signed)
 Delivery Note At 3:49 PM a viable female was delivered via Vaginal, Spontaneous (Presentation: Right Occiput Anterior).  APGAR:  8 at 1 minute and 9 at 5 minutes , ; weight is pending .   Placenta status: Spontaneous, Intact.  Cord: 3 vessels with the following complications: None.  Cord pH: NA  Anesthesia: Epidural Episiotomy: None Lacerations: None Suture Repair: NA Est. Blood Loss (mL): 230 patient exhibited uterine antony and was given 0.2 mg Methergine  IM and 1 gram of TXA IV   Mom to postpartum.  Baby to Couplet care / Skin to Skin.  Rexene Hoit 07/28/2024, 4:41 PM

## 2023-11-14 DIAGNOSIS — R1032 Left lower quadrant pain: Secondary | ICD-10-CM | POA: Diagnosis not present

## 2023-11-14 DIAGNOSIS — N898 Other specified noninflammatory disorders of vagina: Secondary | ICD-10-CM | POA: Diagnosis not present

## 2023-11-21 DIAGNOSIS — R1032 Left lower quadrant pain: Secondary | ICD-10-CM | POA: Diagnosis not present

## 2023-11-21 DIAGNOSIS — N83202 Unspecified ovarian cyst, left side: Secondary | ICD-10-CM | POA: Diagnosis not present

## 2023-12-08 DIAGNOSIS — N912 Amenorrhea, unspecified: Secondary | ICD-10-CM | POA: Diagnosis not present

## 2023-12-08 DIAGNOSIS — Z3201 Encounter for pregnancy test, result positive: Secondary | ICD-10-CM | POA: Diagnosis not present

## 2023-12-21 DIAGNOSIS — Z3480 Encounter for supervision of other normal pregnancy, unspecified trimester: Secondary | ICD-10-CM | POA: Diagnosis not present

## 2023-12-21 LAB — OB RESULTS CONSOLE HEPATITIS B SURFACE ANTIGEN: Hepatitis B Surface Ag: NEGATIVE

## 2023-12-21 LAB — OB RESULTS CONSOLE HIV ANTIBODY (ROUTINE TESTING): HIV: NONREACTIVE

## 2023-12-21 LAB — HEPATITIS C ANTIBODY: HCV Ab: NEGATIVE

## 2023-12-21 LAB — OB RESULTS CONSOLE RUBELLA ANTIBODY, IGM: Rubella: IMMUNE

## 2023-12-22 DIAGNOSIS — O26899 Other specified pregnancy related conditions, unspecified trimester: Secondary | ICD-10-CM | POA: Diagnosis not present

## 2024-01-12 ENCOUNTER — Other Ambulatory Visit: Payer: Self-pay | Admitting: Obstetrics and Gynecology

## 2024-01-12 ENCOUNTER — Other Ambulatory Visit (HOSPITAL_COMMUNITY)
Admission: RE | Admit: 2024-01-12 | Discharge: 2024-01-12 | Disposition: A | Discharge status: Home / Self Care | Source: Ambulatory Visit | Attending: Obstetrics and Gynecology | Admitting: Obstetrics and Gynecology

## 2024-01-12 DIAGNOSIS — Z3490 Encounter for supervision of normal pregnancy, unspecified, unspecified trimester: Secondary | ICD-10-CM | POA: Diagnosis not present

## 2024-01-12 DIAGNOSIS — Z3491 Encounter for supervision of normal pregnancy, unspecified, first trimester: Secondary | ICD-10-CM | POA: Insufficient documentation

## 2024-01-12 DIAGNOSIS — Z3481 Encounter for supervision of other normal pregnancy, first trimester: Secondary | ICD-10-CM | POA: Diagnosis not present

## 2024-01-16 LAB — CYTOLOGY - PAP: Diagnosis: NEGATIVE

## 2024-02-29 ENCOUNTER — Encounter (HOSPITAL_COMMUNITY): Payer: Self-pay | Admitting: *Deleted

## 2024-02-29 ENCOUNTER — Inpatient Hospital Stay (HOSPITAL_COMMUNITY)
Admission: AD | Admit: 2024-02-29 | Discharge: 2024-03-01 | Disposition: A | Discharge status: Home / Self Care | Attending: Obstetrics and Gynecology | Admitting: Obstetrics and Gynecology

## 2024-02-29 DIAGNOSIS — Z3A17 17 weeks gestation of pregnancy: Secondary | ICD-10-CM | POA: Insufficient documentation

## 2024-02-29 DIAGNOSIS — O26892 Other specified pregnancy related conditions, second trimester: Secondary | ICD-10-CM | POA: Diagnosis not present

## 2024-02-29 DIAGNOSIS — R109 Unspecified abdominal pain: Secondary | ICD-10-CM | POA: Insufficient documentation

## 2024-02-29 DIAGNOSIS — O99891 Other specified diseases and conditions complicating pregnancy: Secondary | ICD-10-CM | POA: Insufficient documentation

## 2024-02-29 DIAGNOSIS — R197 Diarrhea, unspecified: Secondary | ICD-10-CM | POA: Insufficient documentation

## 2024-02-29 MED ORDER — ONDANSETRON 4 MG PO TBDP
4.0000 mg | ORAL_TABLET | Freq: Once | ORAL | Status: AC
  Administered 2024-03-01: 4 mg via ORAL
  Filled 2024-02-29: qty 1

## 2024-02-29 MED ORDER — HYOSCYAMINE SULFATE 0.125 MG SL SUBL
0.1250 mg | SUBLINGUAL_TABLET | Freq: Once | SUBLINGUAL | Status: AC
  Administered 2024-03-01: 0.125 mg via SUBLINGUAL
  Filled 2024-02-29: qty 1

## 2024-02-29 NOTE — MAU Note (Addendum)
 Carol Lopez is a 23 y.o. at [redacted]w[redacted]d here in MAU reporting diarrhea and tightening and cramping in her abdomen since 0200. She reports 5 watery stools today. No n/v but does not have much appetite. Able to drink water ok. No one at home has been sick. Denies VB or LOF. Has not taken any meds for diarrhea  LMP: n/a Onset of complaint: 0200 Pain score: 6 Vitals:   02/29/24 2320 02/29/24 2323  BP:  109/63  Pulse: (!) 117   Resp: 17   Temp: 98.7 F (37.1 C)   SpO2: 100%      FHT: 172  Lab orders placed from triage: u/a

## 2024-03-01 DIAGNOSIS — R109 Unspecified abdominal pain: Secondary | ICD-10-CM

## 2024-03-01 DIAGNOSIS — Z3A17 17 weeks gestation of pregnancy: Secondary | ICD-10-CM | POA: Diagnosis not present

## 2024-03-01 DIAGNOSIS — O26892 Other specified pregnancy related conditions, second trimester: Secondary | ICD-10-CM | POA: Diagnosis not present

## 2024-03-01 DIAGNOSIS — O99891 Other specified diseases and conditions complicating pregnancy: Secondary | ICD-10-CM | POA: Diagnosis not present

## 2024-03-01 DIAGNOSIS — R197 Diarrhea, unspecified: Secondary | ICD-10-CM

## 2024-03-01 LAB — URINALYSIS, ROUTINE W REFLEX MICROSCOPIC
Bilirubin Urine: NEGATIVE
Glucose, UA: NEGATIVE mg/dL
Hgb urine dipstick: NEGATIVE
Ketones, ur: NEGATIVE mg/dL
Leukocytes,Ua: NEGATIVE
Nitrite: NEGATIVE
Protein, ur: NEGATIVE mg/dL
Specific Gravity, Urine: 1.015 (ref 1.005–1.030)
pH: 5 (ref 5.0–8.0)

## 2024-03-01 MED ORDER — ONDANSETRON 4 MG PO TBDP: 4.0000 mg | ORAL_TABLET | Freq: Four times a day (QID) | ORAL | 0 refills | Status: DC | PRN

## 2024-03-01 MED ORDER — HYOSCYAMINE SULFATE 0.125 MG SL SUBL: 0.1250 mg | SUBLINGUAL_TABLET | SUBLINGUAL | 0 refills | Status: DC | PRN

## 2024-03-01 NOTE — MAU Provider Note (Signed)
 Chief Complaint:  Abdominal Pain and Diarrhea Seen at 0050  None    HPI: Carol Lopez is a 23 y.o. G2P1001 at 36w1dwho presents to maternity admissions reporting diarrhea for the past few hours.  No nausea or vomiting.  Able to tolerate PO intake. She reports good fetal movement, denies LOF, vaginal bleeding,  n/v, constipation or fever/chills.    Diarrhea  This is a new problem. The current episode started today. Associated symptoms include abdominal pain (cramping). Pertinent negatives include no bloating or chills. Nothing aggravates the symptoms. There are no known risk factors. She has tried nothing for the symptoms.   RN Note: Carol Lopez is a 23 y.o. at [redacted]w[redacted]d here in MAU reporting diarrhea and tightening and cramping in her abdomen since 0200. She reports 5 watery stools today. No n/v but does not have much appetite. Able to drink water ok. No one at home has been sick. Denies VB or LOF. Has not taken any meds for diarrhea Onset of complaint: 0200  Past Medical History: Past Medical History:  Diagnosis Date   Medical history non-contributory     Past obstetric history: OB History  Gravida Para Term Preterm AB Living  2 1 1   1   SAB IAB Ectopic Multiple Live Births     0 1    # Outcome Date GA Lbr Len/2nd Weight Sex Type Anes PTL Lv  2 Current           1 Term 01/09/21 [redacted]w[redacted]d 05:38 / 02:18 3470 g F Vag-Spont EPI  LIV    Past Surgical History: Past Surgical History:  Procedure Laterality Date   NO PAST SURGERIES      Family History: Family History  Problem Relation Age of Onset   Diabetes Mother    Diabetes Maternal Grandmother    Healthy Father     Social History: Social History   Tobacco Use   Smoking status: Never   Smokeless tobacco: Never  Vaping Use   Vaping status: Never Used  Substance Use Topics   Alcohol use: Never   Drug use: Never    Allergies: No Known Allergies  Meds:  Medications Prior to Admission  Medication Sig Dispense  Refill Last Dose/Taking   acetaminophen  (TYLENOL ) 500 MG tablet Take 1,000 mg by mouth every 6 (six) hours as needed for fever or moderate pain.      ferrous sulfate  325 (65 FE) MG tablet Take 1 tablet (325 mg total) by mouth daily with breakfast. 30 tablet 1    ibuprofen  (ADVIL ) 600 MG tablet Take 1 tablet (600 mg total) by mouth every 6 (six) hours as needed. 30 tablet 1    Prenatal Vit-Fe Fumarate-FA (PRENATAL VITAMIN) 27-0.8 MG TABS Take by mouth.       I have reviewed patient's Past Medical Hx, Surgical Hx, Family Hx, Social Hx, medications and allergies.   ROS:  Review of Systems  Constitutional:  Negative for chills.  Gastrointestinal:  Positive for abdominal pain (cramping) and diarrhea. Negative for bloating.   Other systems negative  Physical Exam  Patient Vitals for the past 24 hrs:  BP Temp Pulse Resp SpO2 Height Weight  02/29/24 2323 109/63 -- -- -- -- -- --  02/29/24 2320 -- 98.7 F (37.1 C) (!) 117 17 100 % 5\' 2"  (1.575 m) 73.9 kg   Constitutional: Well-developed, well-nourished female in no acute distress.  Cardiovascular: normal rate  Respiratory: normal effort GI: Abd soft, non-tender, gravid appropriate for gestational age.  MS: Extremities nontender, no edema, normal ROM Neurologic: Alert and oriented x 4.   PELVIC EXAM: deferred   FHT:  172   Labs: Results for orders placed or performed during the hospital encounter of 02/29/24 (from the past 24 hours)  Urinalysis, Routine w reflex microscopic -Urine, Clean Catch     Status: Abnormal   Collection Time: 02/29/24 11:55 PM  Result Value Ref Range   Color, Urine YELLOW YELLOW   APPearance HAZY (A) CLEAR   Specific Gravity, Urine 1.015 1.005 - 1.030   pH 5.0 5.0 - 8.0   Glucose, UA NEGATIVE NEGATIVE mg/dL   Hgb urine dipstick NEGATIVE NEGATIVE   Bilirubin Urine NEGATIVE NEGATIVE   Ketones, ur NEGATIVE NEGATIVE mg/dL   Protein, ur NEGATIVE NEGATIVE mg/dL   Nitrite NEGATIVE NEGATIVE   Leukocytes,Ua  NEGATIVE NEGATIVE      Imaging:  No results found.  MAU Course/MDM: I have reviewed the triage vital signs and the nursing notes.   Pertinent labs & imaging results that were available during my care of the patient were reviewed by me and considered in my medical decision making (see chart for details).      I have reviewed her medical records including past results, notes and treatments.   I have ordered labs and reviewed results. UA is clear   Treatments in MAU included Levsin and Zofran .  Cramping did improve.  Discussed this will likely last 24-36 hours but if symptoms worsen, needs to be reevaluated..    Assessment: S[place] IUP at [redacted]w[redacted]d Diarrhea Abdominal (intestinal) cramping  Plan: Discharge home Rx Levsin for cramping Rx Zofran  for nausea prn  Follow up in Office for prenatal visits and recheck Encouraged to return if she develops worsening of symptoms, increase in pain, fever, or other concerning symptoms.   Pt stable at time of discharge.  Holmes Lusher CNM, MSN Certified Nurse-Midwife 03/01/2024 12:56 AM

## 2024-03-12 DIAGNOSIS — Z36 Encounter for antenatal screening for chromosomal anomalies: Secondary | ICD-10-CM | POA: Diagnosis not present

## 2024-05-15 DIAGNOSIS — Z3482 Encounter for supervision of other normal pregnancy, second trimester: Secondary | ICD-10-CM | POA: Diagnosis not present

## 2024-05-15 DIAGNOSIS — Z3490 Encounter for supervision of normal pregnancy, unspecified, unspecified trimester: Secondary | ICD-10-CM | POA: Diagnosis not present

## 2024-05-15 LAB — OB RESULTS CONSOLE RPR: RPR: NONREACTIVE

## 2024-05-28 DIAGNOSIS — Z23 Encounter for immunization: Secondary | ICD-10-CM | POA: Diagnosis not present

## 2024-05-30 DIAGNOSIS — Z3483 Encounter for supervision of other normal pregnancy, third trimester: Secondary | ICD-10-CM | POA: Diagnosis not present

## 2024-05-30 DIAGNOSIS — Z3482 Encounter for supervision of other normal pregnancy, second trimester: Secondary | ICD-10-CM | POA: Diagnosis not present

## 2024-07-09 DIAGNOSIS — Z3490 Encounter for supervision of normal pregnancy, unspecified, unspecified trimester: Secondary | ICD-10-CM | POA: Diagnosis not present

## 2024-07-09 LAB — OB RESULTS CONSOLE GBS: GBS: POSITIVE

## 2024-07-11 ENCOUNTER — Inpatient Hospital Stay (HOSPITAL_COMMUNITY)

## 2024-07-11 ENCOUNTER — Inpatient Hospital Stay (HOSPITAL_COMMUNITY)
Admission: AD | Admit: 2024-07-11 | Discharge: 2024-07-11 | Disposition: A | Discharge status: Home / Self Care | Attending: Family Medicine | Admitting: Family Medicine

## 2024-07-11 ENCOUNTER — Encounter (HOSPITAL_COMMUNITY): Payer: Self-pay | Admitting: Family Medicine

## 2024-07-11 DIAGNOSIS — O4693 Antepartum hemorrhage, unspecified, third trimester: Secondary | ICD-10-CM | POA: Diagnosis not present

## 2024-07-11 DIAGNOSIS — O368330 Maternal care for abnormalities of the fetal heart rate or rhythm, third trimester, not applicable or unspecified: Secondary | ICD-10-CM | POA: Diagnosis not present

## 2024-07-11 DIAGNOSIS — Z3A36 36 weeks gestation of pregnancy: Secondary | ICD-10-CM

## 2024-07-11 DIAGNOSIS — O4703 False labor before 37 completed weeks of gestation, third trimester: Secondary | ICD-10-CM | POA: Insufficient documentation

## 2024-07-11 DIAGNOSIS — O26853 Spotting complicating pregnancy, third trimester: Secondary | ICD-10-CM | POA: Insufficient documentation

## 2024-07-11 LAB — COMPREHENSIVE METABOLIC PANEL WITH GFR
ALT: 13 U/L (ref 0–44)
AST: 21 U/L (ref 15–41)
Albumin: 2.8 g/dL — ABNORMAL LOW (ref 3.5–5.0)
Alkaline Phosphatase: 125 U/L (ref 38–126)
Anion gap: 10 (ref 5–15)
BUN: 8 mg/dL (ref 6–20)
CO2: 22 mmol/L (ref 22–32)
Calcium: 8.7 mg/dL — ABNORMAL LOW (ref 8.9–10.3)
Chloride: 106 mmol/L (ref 98–111)
Creatinine, Ser: 0.64 mg/dL (ref 0.44–1.00)
GFR, Estimated: >60 mL/min (ref >60)
Glucose, Bld: 88 mg/dL (ref 70–99)
Potassium: 3.7 mmol/L (ref 3.5–5.1)
Sodium: 138 mmol/L (ref 135–145)
Total Bilirubin: 0.7 mg/dL (ref 0.0–1.2)
Total Protein: 6.2 g/dL — ABNORMAL LOW (ref 6.5–8.1)

## 2024-07-11 LAB — CBC WITH DIFFERENTIAL/PLATELET
Abs Immature Granulocytes: 0.06 K/uL (ref 0.00–0.07)
Basophils Absolute: 0 K/uL (ref 0.0–0.1)
Basophils Relative: 0 %
Eosinophils Absolute: 0 K/uL (ref 0.0–0.5)
Eosinophils Relative: 1 %
HCT: 29.5 % — ABNORMAL LOW (ref 36.0–46.0)
Hemoglobin: 9.7 g/dL — ABNORMAL LOW (ref 12.0–15.0)
Immature Granulocytes: 1 %
Lymphocytes Relative: 23 %
Lymphs Abs: 1.5 K/uL (ref 0.7–4.0)
MCH: 26.1 pg (ref 26.0–34.0)
MCHC: 32.9 g/dL (ref 30.0–36.0)
MCV: 79.3 fL — ABNORMAL LOW (ref 80.0–100.0)
Monocytes Absolute: 0.6 K/uL (ref 0.1–1.0)
Monocytes Relative: 10 %
Neutro Abs: 4.2 K/uL (ref 1.7–7.7)
Neutrophils Relative %: 65 %
Platelets: 215 K/uL (ref 150–400)
RBC: 3.72 MIL/uL — ABNORMAL LOW (ref 3.87–5.11)
RDW: 13.3 % (ref 11.5–15.5)
WBC: 6.4 K/uL (ref 4.0–10.5)
nRBC: 0 % (ref 0.0–0.2)

## 2024-07-11 LAB — WET PREP, GENITAL
Clue Cells Wet Prep HPF POC: NONE SEEN
Sperm: NONE SEEN
Trich, Wet Prep: NONE SEEN
WBC, Wet Prep HPF POC: >=10 — AB (ref <10)
Yeast Wet Prep HPF POC: NONE SEEN

## 2024-07-11 LAB — RUPTURE OF MEMBRANE (ROM)PLUS: Rom Plus: NEGATIVE

## 2024-07-11 NOTE — MAU Provider Note (Signed)
 Chief Complaint:  Vaginal Bleeding   HPI   None     Carol Lopez is a 23 y.o. G2P1001 at [redacted]w[redacted]d presenting to maternity admissions reporting vaginal bleeding. She endorses bright red blood and a small clot on the toilet paper when she wiped this morning. She has light bleeding in her shorts upon arrival to MAU. She denies contractions, endorses mild lower abdominal cramping pain, and reports good fetal movement. History negative for history of C-section, history of abruption, history of ruptured membranes, previa on ultrasound, and history of preterm delivery. She Reports recent intercourse 2 days ago and a cervical check 2 days ago. Patient does not have history of risk factors: smoking, hypertension, and infections.  Pregnancy Course: Receives care at [facility]. Prenatal records reviewed.   Past Medical History:  Diagnosis Date   Medical history non-contributory    OB History  Gravida Para Term Preterm AB Living  2 1 1   1   SAB IAB Ectopic Multiple Live Births     0 1    # Outcome Date GA Lbr Len/2nd Weight Sex Type Anes PTL Lv  2 Current           1 Term 01/09/21 [redacted]w[redacted]d 05:38 / 02:18 3470 g F Vag-Spont EPI  LIV   Past Surgical History:  Procedure Laterality Date   NO PAST SURGERIES     Family History  Problem Relation Age of Onset   Diabetes Mother    Diabetes Maternal Grandmother    Healthy Father    Social History   Tobacco Use   Smoking status: Never   Smokeless tobacco: Never  Vaping Use   Vaping status: Never Used  Substance Use Topics   Alcohol use: Never   Drug use: Never   No Known Allergies Medications Prior to Admission  Medication Sig Dispense Refill Last Dose/Taking   Prenatal Vit-Fe Fumarate-FA (PRENATAL VITAMIN) 27-0.8 MG TABS Take by mouth.   07/10/2024   acetaminophen  (TYLENOL ) 500 MG tablet Take 1,000 mg by mouth every 6 (six) hours as needed for fever or moderate pain.      ferrous sulfate  325 (65 FE) MG tablet Take 1 tablet (325 mg total)  by mouth daily with breakfast. 30 tablet 1    hyoscyamine  (LEVSIN /SL) 0.125 MG SL tablet Place 1 tablet (0.125 mg total) under the tongue every 4 (four) hours as needed for cramping. 20 tablet 0    ondansetron  (ZOFRAN -ODT) 4 MG disintegrating tablet Take 1 tablet (4 mg total) by mouth every 6 (six) hours as needed for nausea. 20 tablet 0     I have reviewed patient's Past Medical Hx, Surgical Hx, Family Hx, Social Hx, medications and allergies.   ROS  Pertinent items noted in HPI and remainder of comprehensive ROS otherwise negative.   PHYSICAL EXAM  Patient Vitals for the past 24 hrs:  BP Temp Temp src Pulse Resp SpO2 Height Weight  07/11/24 0919 106/66 -- -- (!) 122 -- -- -- --  07/11/24 0856 111/72 98.9 F (37.2 C) Oral (!) 114 18 100 % 5' 2 (1.575 m) 83.6 kg    Constitutional: Well-developed, well-nourished female in no acute distress.  HEENT: atraumatic, normocephalic. Neck has normal ROM. EOM intact. Cardiovascular: normal rate & rhythm, warm and well-perfused Respiratory: normal effort, no problems with respiration noted GI: Abd soft, non-tender, non-distended MSK: Extremities nontender, no edema, normal ROM Skin: warm and dry. Acyanotic, no jaundice or pallor. Neurologic: Alert and oriented x 4. No abnormal coordination. Psychiatric: Normal  mood. Speech not slurred, not rapid/pressured. Patient is cooperative. GU: no CVA tenderness Pelvic exam: VULVA: normal appearing vulva with no masses, tenderness or lesions, VAGINA: normal appearing vagina with normal color and discharge, no lesions, CERVIX: multiparous os, brown blood in vaginal canal, no fluid from cervix when patient coughed, no active bleeding identified, exam chaperoned by Bobbette Blumenthal RN.  Dilation: Fingertip Effacement (%): Thick Cervical Position: Posterior Station: Ballotable Presentation: Vertex Exam by:: EMERSON Sear, NP  Fetal Tracing: Baseline FHR: 150 per minute Fetal heart variability: moderate Fetal  Heart Rate accelerations: yes Fetal Heart Rate decelerations: none Fetal Non-stress Test: Category I (reactive) Toco: uterine irritability with irregular uterine contractions   Labs: Results for orders placed or performed during the hospital encounter of 07/11/24 (from the past 24 hours)  CBC with Differential/Platelet     Status: Abnormal   Collection Time: 07/11/24  9:48 AM  Result Value Ref Range   WBC 6.4 4.0 - 10.5 K/uL   RBC 3.72 (L) 3.87 - 5.11 MIL/uL   Hemoglobin 9.7 (L) 12.0 - 15.0 g/dL   HCT 70.4 (L) 63.9 - 53.9 %   MCV 79.3 (L) 80.0 - 100.0 fL   MCH 26.1 26.0 - 34.0 pg   MCHC 32.9 30.0 - 36.0 g/dL   RDW 86.6 88.4 - 84.4 %   Platelets 215 150 - 400 K/uL   nRBC 0.0 0.0 - 0.2 %   Neutrophils Relative % 65 %   Neutro Abs 4.2 1.7 - 7.7 K/uL   Lymphocytes Relative 23 %   Lymphs Abs 1.5 0.7 - 4.0 K/uL   Monocytes Relative 10 %   Monocytes Absolute 0.6 0.1 - 1.0 K/uL   Eosinophils Relative 1 %   Eosinophils Absolute 0.0 0.0 - 0.5 K/uL   Basophils Relative 0 %   Basophils Absolute 0.0 0.0 - 0.1 K/uL   Immature Granulocytes 1 %   Abs Immature Granulocytes 0.06 0.00 - 0.07 K/uL  Comprehensive metabolic panel     Status: Abnormal   Collection Time: 07/11/24  9:48 AM  Result Value Ref Range   Sodium 138 135 - 145 mmol/L   Potassium 3.7 3.5 - 5.1 mmol/L   Chloride 106 98 - 111 mmol/L   CO2 22 22 - 32 mmol/L   Glucose, Bld 88 70 - 99 mg/dL   BUN 8 6 - 20 mg/dL   Creatinine, Ser 9.35 0.44 - 1.00 mg/dL   Calcium  8.7 (L) 8.9 - 10.3 mg/dL   Total Protein 6.2 (L) 6.5 - 8.1 g/dL   Albumin 2.8 (L) 3.5 - 5.0 g/dL   AST 21 15 - 41 U/L   ALT 13 0 - 44 U/L   Alkaline Phosphatase 125 38 - 126 U/L   Total Bilirubin 0.7 0.0 - 1.2 mg/dL   GFR, Estimated >39 >39 mL/min   Anion gap 10 5 - 15  Wet prep, genital     Status: Abnormal   Collection Time: 07/11/24  9:59 AM   Specimen: PATH Cytology Cervicovaginal Ancillary Only  Result Value Ref Range   Yeast Wet Prep HPF POC NONE SEEN  NONE SEEN   Trich, Wet Prep NONE SEEN NONE SEEN   Clue Cells Wet Prep HPF POC NONE SEEN NONE SEEN   WBC, Wet Prep HPF POC >=10 (A) <10   Sperm NONE SEEN   Rupture of Membrane (ROM) Plus     Status: None   Collection Time: 07/11/24 11:24 AM  Result Value Ref Range   Rom Plus  NEGATIVE     Imaging:  No results found.  MDM & MAU COURSE  MDM: High  MAU Course: -Vital signs within normal limits. -Speculum exam with swabs collected. Cervical exam with closed cervix. -No abdominal pain or tenderness. -CBC with Hgb 9.7, otherwise no significant abnormalities. -CMP within normal limits for pregnancy. No renal, hepatic, or electrolyte abnormalities. -Wet prep negative for infection. -US  to confirm to placenta previa, no placental abruption, no oligohydramnios.  -Personally reviewed US  images and preliminary report. No placental abruption, previa. AFI within normal limits. -ROM Plus negative.  Differential diagnosis considered for 2nd or 3rd trimester vaginal bleeding includes but is not limited to: preterm labor, placenta previa, vasa previa, placental abruption, infection, marginal separation, cervical or vaginal lesion   Orders Placed This Encounter  Procedures   Wet prep, genital   US  MFM OB LIMITED   CBC with Differential/Platelet   Comprehensive metabolic panel   Urinalysis, Routine w reflex microscopic -Urine, Clean Catch   Rupture of Membrane (ROM) Plus   Discharge patient   No orders of the defined types were placed in this encounter.   ASSESSMENT   1. Vaginal bleeding in pregnancy, third trimester   2. [redacted] weeks gestation of pregnancy     PLAN  Discharge home in stable condition with preterm labor precautions.  Discussed possibility of spotting/light vaginal bleeding after cervical checks, intercourse, etc.    Allergies as of 07/11/2024   No Known Allergies      Medication List     TAKE these medications    acetaminophen  500 MG tablet Commonly known as:  TYLENOL  Take 1,000 mg by mouth every 6 (six) hours as needed for fever or moderate pain.   ferrous sulfate  325 (65 FE) MG tablet Take 1 tablet (325 mg total) by mouth daily with breakfast.   hyoscyamine  0.125 MG SL tablet Commonly known as: Levsin /SL Place 1 tablet (0.125 mg total) under the tongue every 4 (four) hours as needed for cramping.   ondansetron  4 MG disintegrating tablet Commonly known as: ZOFRAN -ODT Take 1 tablet (4 mg total) by mouth every 6 (six) hours as needed for nausea.   Prenatal Vitamin 27-0.8 MG Tabs Take by mouth.        Joesph DELENA Sear, PA

## 2024-07-11 NOTE — MAU Note (Signed)
 Carol Lopez is a 23 y.o. at [redacted]w[redacted]d here in MAU reporting: woke up this morning, not a lot of bleeding when she used the restroom.  Bright red and a clot when she wiped.  Light in shorts.  Denies hx of previa or low lying placenta.  Intercourse 2 days ago. Cx checked on Tues.  Little cramping in lower pelvic Onset of complaint: this morning Pain score: mild Vitals:   07/11/24 0856  BP: 111/72  Pulse: (!) 114  Resp: 18  Temp: 98.9 F (37.2 C)  SpO2: 100%     FHT:157 Lab orders placed from triage:

## 2024-07-11 NOTE — Discharge Instructions (Signed)

## 2024-07-12 LAB — GC/CHLAMYDIA PROBE AMP (~~LOC~~) NOT AT ARMC
Chlamydia: NEGATIVE
Comment: NEGATIVE
Comment: NORMAL
Neisseria Gonorrhea: NEGATIVE

## 2024-07-28 ENCOUNTER — Encounter (HOSPITAL_COMMUNITY): Payer: Self-pay | Admitting: Obstetrics and Gynecology

## 2024-07-28 ENCOUNTER — Inpatient Hospital Stay (HOSPITAL_COMMUNITY): Admitting: Anesthesiology

## 2024-07-28 ENCOUNTER — Other Ambulatory Visit: Payer: Self-pay

## 2024-07-28 ENCOUNTER — Inpatient Hospital Stay (HOSPITAL_COMMUNITY)
Admission: AD | Admit: 2024-07-28 | Discharge: 2024-07-30 | DRG: 807 | Disposition: A | Discharge status: Home / Self Care | Attending: Obstetrics and Gynecology | Admitting: Obstetrics and Gynecology

## 2024-07-28 DIAGNOSIS — Z3A38 38 weeks gestation of pregnancy: Secondary | ICD-10-CM | POA: Diagnosis not present

## 2024-07-28 DIAGNOSIS — O99824 Streptococcus B carrier state complicating childbirth: Principal | ICD-10-CM | POA: Diagnosis present

## 2024-07-28 DIAGNOSIS — Z833 Family history of diabetes mellitus: Secondary | ICD-10-CM | POA: Diagnosis not present

## 2024-07-28 DIAGNOSIS — O26893 Other specified pregnancy related conditions, third trimester: Secondary | ICD-10-CM | POA: Diagnosis not present

## 2024-07-28 LAB — CBC
HCT: 32 % — ABNORMAL LOW (ref 36.0–46.0)
Hemoglobin: 10.2 g/dL — ABNORMAL LOW (ref 12.0–15.0)
MCH: 25.3 pg — ABNORMAL LOW (ref 26.0–34.0)
MCHC: 31.9 g/dL (ref 30.0–36.0)
MCV: 79.4 fL — ABNORMAL LOW (ref 80.0–100.0)
Platelets: 219 K/uL (ref 150–400)
RBC: 4.03 MIL/uL (ref 3.87–5.11)
RDW: 13.9 % (ref 11.5–15.5)
WBC: 6.5 K/uL (ref 4.0–10.5)
nRBC: 0 % (ref 0.0–0.2)

## 2024-07-28 LAB — RPR: RPR Ser Ql: NONREACTIVE

## 2024-07-28 LAB — TYPE AND SCREEN
ABO/RH(D): B POS
Antibody Screen: NEGATIVE

## 2024-07-28 MED ORDER — METHYLERGONOVINE MALEATE 0.2 MG/ML IJ SOLN
INTRAMUSCULAR | Status: AC
  Administered 2024-07-28: 0.2 mg via INTRAMUSCULAR
  Filled 2024-07-28: qty 1

## 2024-07-28 MED ORDER — WITCH HAZEL-GLYCERIN EX PADS: 1.0000 | MEDICATED_PAD | CUTANEOUS | Status: DC | PRN

## 2024-07-28 MED ORDER — OXYCODONE-ACETAMINOPHEN 5-325 MG PO TABS: 2.0000 | ORAL_TABLET | ORAL | Status: DC | PRN

## 2024-07-28 MED ORDER — METHYLERGONOVINE MALEATE 0.2 MG/ML IJ SOLN: 0.2000 mg | INTRAMUSCULAR | Status: DC | PRN

## 2024-07-28 MED ORDER — ACETAMINOPHEN 325 MG PO TABS: 650.0000 mg | ORAL_TABLET | ORAL | Status: DC | PRN

## 2024-07-28 MED ORDER — LIDOCAINE HCL (PF) 1 % IJ SOLN: 30.0000 mL | INTRAMUSCULAR | Status: DC | PRN

## 2024-07-28 MED ORDER — FENTANYL-BUPIVACAINE-NACL 0.5-0.125-0.9 MG/250ML-% EP SOLN
12.0000 mL/h | EPIDURAL | Status: DC | PRN
  Administered 2024-07-28: 12 mL/h via EPIDURAL
  Filled 2024-07-28: qty 250

## 2024-07-28 MED ORDER — ONDANSETRON HCL 4 MG/2ML IJ SOLN: 4.0000 mg | Freq: Four times a day (QID) | INTRAMUSCULAR | Status: DC | PRN

## 2024-07-28 MED ORDER — ONDANSETRON HCL 4 MG PO TABS: 4.0000 mg | ORAL_TABLET | ORAL | Status: DC | PRN

## 2024-07-28 MED ORDER — METHYLERGONOVINE MALEATE 0.2 MG/ML IJ SOLN: 0.2000 mg | Freq: Once | INTRAMUSCULAR | Status: AC

## 2024-07-28 MED ORDER — PHENYLEPHRINE 80 MCG/ML (10ML) SYRINGE FOR IV PUSH (FOR BLOOD PRESSURE SUPPORT): 80.0000 ug | PREFILLED_SYRINGE | INTRAVENOUS | Status: DC | PRN

## 2024-07-28 MED ORDER — TRANEXAMIC ACID-NACL 1000-0.7 MG/100ML-% IV SOLN: 1000.0000 mg | Freq: Once | INTRAVENOUS | Status: AC

## 2024-07-28 MED ORDER — DIPHENHYDRAMINE HCL 50 MG/ML IJ SOLN: 12.5000 mg | INTRAMUSCULAR | Status: DC | PRN

## 2024-07-28 MED ORDER — OXYCODONE HCL 5 MG PO TABS: 5.0000 mg | ORAL_TABLET | ORAL | Status: DC | PRN

## 2024-07-28 MED ORDER — SIMETHICONE 80 MG PO CHEW: 80.0000 mg | CHEWABLE_TABLET | ORAL | Status: DC | PRN

## 2024-07-28 MED ORDER — METHYLERGONOVINE MALEATE 0.2 MG PO TABS: 0.2000 mg | ORAL_TABLET | ORAL | Status: DC | PRN

## 2024-07-28 MED ORDER — HYDROXYZINE HCL 50 MG PO TABS: 50.0000 mg | ORAL_TABLET | Freq: Four times a day (QID) | ORAL | Status: DC | PRN

## 2024-07-28 MED ORDER — PHENYLEPHRINE 80 MCG/ML (10ML) SYRINGE FOR IV PUSH (FOR BLOOD PRESSURE SUPPORT)
80.0000 ug | PREFILLED_SYRINGE | INTRAVENOUS | Status: DC | PRN
  Filled 2024-07-28: qty 10

## 2024-07-28 MED ORDER — PRENATAL MULTIVITAMIN CH
1.0000 | ORAL_TABLET | Freq: Every day | ORAL | Status: DC
  Administered 2024-07-29 – 2024-07-30 (×2): 1 via ORAL
  Filled 2024-07-28 (×2): qty 1

## 2024-07-28 MED ORDER — EPHEDRINE 5 MG/ML INJ: 10.0000 mg | INTRAVENOUS | Status: DC | PRN

## 2024-07-28 MED ORDER — FENTANYL CITRATE (PF) 100 MCG/2ML IJ SOLN: 50.0000 ug | INTRAMUSCULAR | Status: DC | PRN

## 2024-07-28 MED ORDER — SODIUM CHLORIDE 0.9 % IV SOLN
5.0000 10*6.[IU] | Freq: Once | INTRAVENOUS | Status: AC
  Administered 2024-07-28: 5 10*6.[IU] via INTRAVENOUS
  Filled 2024-07-28: qty 5

## 2024-07-28 MED ORDER — ACETAMINOPHEN 325 MG PO TABS
650.0000 mg | ORAL_TABLET | ORAL | Status: DC | PRN
  Administered 2024-07-29: 650 mg via ORAL
  Filled 2024-07-28: qty 2

## 2024-07-28 MED ORDER — OXYCODONE-ACETAMINOPHEN 5-325 MG PO TABS: 1.0000 | ORAL_TABLET | ORAL | Status: DC | PRN

## 2024-07-28 MED ORDER — TRANEXAMIC ACID-NACL 1000-0.7 MG/100ML-% IV SOLN
INTRAVENOUS | Status: AC
  Administered 2024-07-28: 1000 mg via INTRAVENOUS
  Filled 2024-07-28: qty 100

## 2024-07-28 MED ORDER — LIDOCAINE HCL (PF) 1 % IJ SOLN
INTRAMUSCULAR | Status: DC | PRN
  Administered 2024-07-28: 10 mL via EPIDURAL

## 2024-07-28 MED ORDER — ONDANSETRON HCL 4 MG/2ML IJ SOLN: 4.0000 mg | INTRAMUSCULAR | Status: DC | PRN

## 2024-07-28 MED ORDER — IBUPROFEN 600 MG PO TABS
600.0000 mg | ORAL_TABLET | Freq: Four times a day (QID) | ORAL | Status: DC
  Administered 2024-07-28 – 2024-07-30 (×8): 600 mg via ORAL
  Filled 2024-07-28 (×8): qty 1

## 2024-07-28 MED ORDER — OXYCODONE HCL 5 MG PO TABS: 10.0000 mg | ORAL_TABLET | ORAL | Status: DC | PRN

## 2024-07-28 MED ORDER — LACTATED RINGERS IV SOLN
500.0000 mL | INTRAVENOUS | Status: DC | PRN
  Administered 2024-07-28: 500 mL via INTRAVENOUS

## 2024-07-28 MED ORDER — PENICILLIN G POT IN DEXTROSE 60000 UNIT/ML IV SOLN
3.0000 10*6.[IU] | INTRAVENOUS | Status: DC
  Administered 2024-07-28: 3 10*6.[IU] via INTRAVENOUS
  Filled 2024-07-28: qty 50

## 2024-07-28 MED ORDER — FERROUS SULFATE 325 (65 FE) MG PO TABS
325.0000 mg | ORAL_TABLET | Freq: Two times a day (BID) | ORAL | Status: DC
  Administered 2024-07-29 – 2024-07-30 (×3): 325 mg via ORAL
  Filled 2024-07-28 (×3): qty 1

## 2024-07-28 MED ORDER — DIBUCAINE (PERIANAL) 1 % EX OINT: 1.0000 | TOPICAL_OINTMENT | CUTANEOUS | Status: DC | PRN

## 2024-07-28 MED ORDER — OXYTOCIN-SODIUM CHLORIDE 30-0.9 UT/500ML-% IV SOLN
2.5000 [IU]/h | INTRAVENOUS | Status: DC
  Filled 2024-07-28: qty 500

## 2024-07-28 MED ORDER — SENNOSIDES-DOCUSATE SODIUM 8.6-50 MG PO TABS
2.0000 | ORAL_TABLET | Freq: Every day | ORAL | Status: DC
  Administered 2024-07-29 – 2024-07-30 (×2): 2 via ORAL
  Filled 2024-07-28 (×2): qty 2

## 2024-07-28 MED ORDER — DIPHENHYDRAMINE HCL 25 MG PO CAPS: 25.0000 mg | ORAL_CAPSULE | Freq: Four times a day (QID) | ORAL | Status: DC | PRN

## 2024-07-28 MED ORDER — ZOLPIDEM TARTRATE 5 MG PO TABS: 5.0000 mg | ORAL_TABLET | Freq: Every evening | ORAL | Status: DC | PRN

## 2024-07-28 MED ORDER — OXYTOCIN BOLUS FROM INFUSION
333.0000 mL | Freq: Once | INTRAVENOUS | Status: AC
  Administered 2024-07-28: 333 mL via INTRAVENOUS

## 2024-07-28 MED ORDER — LACTATED RINGERS IV SOLN: INTRAVENOUS | Status: DC

## 2024-07-28 MED ORDER — SOD CITRATE-CITRIC ACID 500-334 MG/5ML PO SOLN: 30.0000 mL | ORAL | Status: DC | PRN

## 2024-07-28 MED ORDER — COCONUT OIL OIL: 1.0000 | TOPICAL_OIL | Status: DC | PRN

## 2024-07-28 MED ORDER — BENZOCAINE-MENTHOL 20-0.5 % EX AERO: 1.0000 | INHALATION_SPRAY | CUTANEOUS | Status: DC | PRN

## 2024-07-28 MED ORDER — LACTATED RINGERS IV SOLN
500.0000 mL | Freq: Once | INTRAVENOUS | Status: AC
  Administered 2024-07-28: 500 mL via INTRAVENOUS

## 2024-07-28 NOTE — H&P (Signed)
 Carol Lopez is a 23 y.o. female presenting  complaining of regular contractions that started at 530 am. No lof no vaginal bleeding. +FM  On arrival to MAU she was 2 cm and progressed to 4 cm within 1 hour. Pregnancy has been uncomplicated.  Prenatal care provided by Dr. Rexene Hoit with [facility].    OB History     Gravida  2   Para  1   Term  1   Preterm      AB      Living  1      SAB      IAB      Ectopic      Multiple  0   Live Births  1          Past Medical History:  Diagnosis Date   Medical history non-contributory    Past Surgical History:  Procedure Laterality Date   NO PAST SURGERIES     Family History: family history includes Diabetes in her maternal grandmother and mother; Healthy in her father. Social History:  reports that she has never smoked. She has never used smokeless tobacco. She reports that she does not drink alcohol and does not use drugs.     Maternal Diabetes: No Genetic Screening: Declined Maternal Ultrasounds/Referrals: Normal Fetal Ultrasounds or other Referrals:  None Maternal Substance Abuse:  No Significant Maternal Medications:  None Significant Maternal Lab Results:  Group B Strep positive Number of Prenatal Visits:greater than 3 verified prenatal visits Maternal Vaccinations:TDap Other Comments:  None  Review of Systems  Constitutional: Negative.   HENT: Negative.    Eyes: Negative.   Respiratory: Negative.    Cardiovascular: Negative.   Gastrointestinal: Negative.   Endocrine: Negative.   Genitourinary: Negative.   Musculoskeletal: Negative.   Skin: Negative.   Allergic/Immunologic: Negative.   Neurological: Negative.   Hematological: Negative.   Psychiatric/Behavioral: Negative.     History Dilation: 4 Effacement (%): 60 Station: -3 Exam by:: S Grindstaff RN Blood pressure (!) 76/40, pulse 91, temperature 98.5 F (36.9 C), temperature source Oral, resp. rate 19, height 5' 3 (1.6 m), weight  84.8 kg, last menstrual period 08/04/2023, SpO2 100%, currently breastfeeding. Maternal Exam:  Introitus: Normal vulva.   Physical Exam Vitals reviewed.  Constitutional:      Appearance: Normal appearance.  HENT:     Head: Normocephalic and atraumatic.     Nose: Nose normal.  Eyes:     Pupils: Pupils are equal, round, and reactive to light.  Cardiovascular:     Rate and Rhythm: Normal rate and regular rhythm.  Pulmonary:     Effort: Pulmonary effort is normal.     Breath sounds: Normal breath sounds.  Abdominal:     Tenderness: There is no abdominal tenderness.  Genitourinary:    General: Normal vulva.  Musculoskeletal:        General: No swelling.     Cervical back: Normal range of motion and neck supple.  Skin:    General: Skin is warm and dry.  Neurological:     General: No focal deficit present.     Mental Status: She is alert and oriented to person, place, and time.  Psychiatric:        Mood and Affect: Mood normal.        Behavior: Behavior normal.     Prenatal labs: ABO, Rh: --/--/B POS (10/12 0936) Antibody: NEG (10/12 0936) Rubella: Immune (03/06 0000) RPR: NON REACTIVE (10/12 0945)  HBsAg: Negative (03/06  0000)  HIV: Non-reactive (03/06 0000)  GBS: Positive/-- (09/23 0000)   Assessment/Plan: 38 weeks and 3 days Normal Labor Admit to labor and delivery GBS + Penicillin for gbs prophylaxis  Pain Control - Epidural  Fetal Well Being- Category 1 and 2 overall category 1 and reassuring  Anticipate SVD   Rexene Hoit 07/28/2024, 12:51 PM

## 2024-07-28 NOTE — Plan of Care (Signed)
Pt demonstrated understanding 

## 2024-07-28 NOTE — Anesthesia Procedure Notes (Signed)
 Epidural Patient location during procedure: OB Start time: 07/28/2024 10:25 AM End time: 07/28/2024 10:35 AM  Staffing Anesthesiologist: Niels Marien CROME, MD Performed: anesthesiologist   Preanesthetic Checklist Completed: patient identified, IV checked, risks and benefits discussed, monitors and equipment checked, pre-op evaluation and timeout performed  Epidural Patient position: sitting Prep: DuraPrep and site prepped and draped Patient monitoring: continuous pulse ox, blood pressure, heart rate and cardiac monitor Approach: midline Location: L3-L4 Injection technique: LOR air  Needle:  Needle type: Tuohy  Needle gauge: 17 G Needle length: 9 cm Needle insertion depth: 7 cm Catheter type: closed end flexible Catheter size: 19 Gauge Catheter at skin depth: 12 cm Test dose: negative  Assessment Sensory level: T8 Events: blood not aspirated, no cerebrospinal fluid, injection not painful, no injection resistance, no paresthesia and negative IV test  Additional Notes Patient identified. Risks/Benefits/Options discussed with patient including but not limited to bleeding, infection, nerve damage, paralysis, failed block, incomplete pain control, headache, blood pressure changes, nausea, vomiting, reactions to medication both or allergic, itching and postpartum back pain. Confirmed with bedside nurse the patient's most recent platelet count. Confirmed with patient that they are not currently taking any anticoagulation, have any bleeding history or any family history of bleeding disorders. Patient expressed understanding and wished to proceed. All questions were answered. Sterile technique was used throughout the entire procedure. Please see nursing notes for vital signs. Test dose was given through epidural catheter and negative prior to continuing to dose epidural or start infusion. Warning signs of high block given to the patient including shortness of breath, tingling/numbness in  hands, complete motor block, or any concerning symptoms with instructions to call for help. Patient was given instructions on fall risk and not to get out of bed. All questions and concerns addressed with instructions to call with any issues or inadequate analgesia.  Reason for block:procedure for pain

## 2024-07-28 NOTE — MAU Note (Signed)
.  Carol Lopez is a 23 y.o. at [redacted]w[redacted]d here in MAU reporting: ctxs that started at 0615. Denies vaginal bleeding or LOF.  Reports +FM  Onset of complaint: TODAY   Pain score: 6/10 There were no vitals filed for this visit.    Lab orders placed from triage:   NONE

## 2024-07-28 NOTE — Anesthesia Preprocedure Evaluation (Signed)
Anesthesia Evaluation  Patient identified by MRN, date of birth, ID band Patient awake    Reviewed: Allergy & Precautions, NPO status , Patient's Chart, lab work & pertinent test results  Airway Mallampati: II  TM Distance: >3 FB Neck ROM: Full    Dental no notable dental hx.    Pulmonary neg pulmonary ROS   Pulmonary exam normal breath sounds clear to auscultation       Cardiovascular negative cardio ROS Normal cardiovascular exam Rhythm:Regular Rate:Normal     Neuro/Psych negative neurological ROS  negative psych ROS   GI/Hepatic negative GI ROS, Neg liver ROS,,,  Endo/Other  negative endocrine ROS    Renal/GU negative Renal ROS  negative genitourinary   Musculoskeletal negative musculoskeletal ROS (+)    Abdominal   Peds  Hematology negative hematology ROS (+)   Anesthesia Other Findings   Reproductive/Obstetrics (+) Pregnancy                             Anesthesia Physical Anesthesia Plan  ASA: 2  Anesthesia Plan: Epidural   Post-op Pain Management:    Induction:   PONV Risk Score and Plan: Treatment may vary due to age or medical condition  Airway Management Planned: Natural Airway  Additional Equipment:   Intra-op Plan:   Post-operative Plan:   Informed Consent: I have reviewed the patients History and Physical, chart, labs and discussed the procedure including the risks, benefits and alternatives for the proposed anesthesia with the patient or authorized representative who has indicated his/her understanding and acceptance.       Plan Discussed with: Anesthesiologist  Anesthesia Plan Comments: (Patient identified. Risks, benefits, options discussed with patient including but not limited to bleeding, infection, nerve damage, paralysis, failed block, incomplete pain control, headache, blood pressure changes, nausea, vomiting, reactions to medication, itching, and  post partum back pain. Confirmed with bedside nurse the patient's most recent platelet count. Confirmed with the patient that they are not taking any anticoagulation, have any bleeding history or any family history of bleeding disorders. Patient expressed understanding and wishes to proceed. All questions were answered. )       Anesthesia Quick Evaluation  

## 2024-07-28 NOTE — Lactation Note (Signed)
 This note was copied from a baby's chart. Lactation Consultation Note  Patient Name: Carol Lopez Date: 07/28/2024 Age:23 hours Reason for consult: Initial assessment;Early term 37-38.6wks;Breastfeeding assistance  P2- [name] initially declined LC assistance, but infant was having difficulty with latching so she told the RN that it was okay for [name] to consult with her. [name] was attempting to latch infant in the cradle hold belly up. With permission, LC reviewed how to place infant belly to belly and nose to nipple. LC also reviewed how to compress the breast to make it easier for infant to latch onto. Infant was eager and latched with the first attempt. Infant was still nursing when [facility] left the room. [name] would not like a follow up unless she requests.  LC reviewed the first 24 hr birthday nap, day 2 cluster feeding, feeding infant on cue 8-12x in 24 hrs, not allowing infant to go over 3 hrs without a feeding, CDC milks storage guidelines, LC services handout and engorgement/breast care. LC encouraged [name] to call for further assistance as needed.  Maternal Data Has patient been taught Hand Expression?: Yes Does the patient have breastfeeding experience prior to this delivery?: Yes How long did the patient breastfeed?: 1 year  Feeding Mother's Current Feeding Choice: Breast Milk  LATCH Score Latch: Grasps breast easily, tongue down, lips flanged, rhythmical sucking.  Audible Swallowing: Spontaneous and intermittent  Type of Nipple: Everted at rest and after stimulation  Comfort (Breast/Nipple): Soft / non-tender  Hold (Positioning): Assistance needed to correctly position infant at breast and maintain latch.  LATCH Score: 9   Lactation Tools Discussed/Used Tools: Pump Breast pump type: Manual Pump Education: Setup, frequency, and cleaning;Milk Storage Reason for Pumping: [name] request Pumping frequency: 15-20 min every 3 hrs  Interventions Interventions: Breast  feeding basics reviewed;Assisted with latch;Breast compression;Adjust position;Support pillows;Position options;Hand pump;Education;LC Services brochure  Discharge Discharge Education: Engorgement and breast care;Warning signs for feeding baby Pump: DEBP;Manual;Personal  Consult Status Consult Status: Complete (mother declined follow up)    Recardo Hoit BS, IBCLC 07/28/2024, 9:12 PM

## 2024-07-29 LAB — CBC
HCT: 30.3 % — ABNORMAL LOW (ref 36.0–46.0)
Hemoglobin: 10.1 g/dL — ABNORMAL LOW (ref 12.0–15.0)
MCH: 25.8 pg — ABNORMAL LOW (ref 26.0–34.0)
MCHC: 33.3 g/dL (ref 30.0–36.0)
MCV: 77.3 fL — ABNORMAL LOW (ref 80.0–100.0)
Platelets: 229 K/uL (ref 150–400)
RBC: 3.92 MIL/uL (ref 3.87–5.11)
RDW: 13.7 % (ref 11.5–15.5)
WBC: 9.4 K/uL (ref 4.0–10.5)
nRBC: 0 % (ref 0.0–0.2)

## 2024-07-29 NOTE — Lactation Note (Signed)
 This note was copied from a baby's chart. Lactation Consultation Note  Patient Name: Carol Lopez Unijb'd Date: 07/29/2024 Age:23 hours  P2, Per [name], infant is not latching well today, will fall asleep at the breast. [name] will like latch assistance with the next latch, infant was recently given 10 mls of 20 kcal formula and [name] was using the DEBP as LC entered the room. [name] is experienced with breastfeeding she breastfeed and pumped with her 1st child for over one year. [name] will continue to breastfeed infant by cues, on demand, 8-12 times within 24 hours, skin to skin.    Maternal Data    Feeding Nipple Type: Slow - flow  LATCH Score                    Lactation Tools Discussed/Used    Interventions    Discharge    Consult Status      Grayce LULLA Batter 07/29/2024, 1:13 PM

## 2024-07-29 NOTE — Anesthesia Postprocedure Evaluation (Signed)
 Anesthesia Post Note  Patient: Magalene Durand  Procedure(s) Performed: AN AD HOC LABOR EPIDURAL     Patient location during evaluation: Mother Baby Anesthesia Type: Epidural Level of consciousness: awake and alert Pain management: pain level controlled Vital Signs Assessment: post-procedure vital signs reviewed and stable Respiratory status: spontaneous breathing, nonlabored ventilation and respiratory function stable Cardiovascular status: stable Postop Assessment: no headache, no backache, epidural receding and able to ambulate Anesthetic complications: no   No notable events documented.  Last Vitals:  Vitals:   07/29/24 0337 07/29/24 0733  BP: 120/72 111/67  Pulse: 75 64  Resp:  18  Temp: 36.8 C 36.6 C  SpO2:  100%    Last Pain:  Vitals:   07/29/24 0733  TempSrc: Oral  PainSc: 0-No pain   Pain Goal: Patients Stated Pain Goal: 0 (07/28/24 1819)              Epidural/Spinal Function Cutaneous sensation: Normal sensation (07/29/24 0733), Patient able to flex knees: Yes (07/29/24 0733), Patient able to lift hips off bed: Yes (07/29/24 0733), Back pain beyond tenderness at insertion site: No (07/29/24 0733), Progressively worsening motor and/or sensory loss: No (07/29/24 0733)  Stepahnie Campo

## 2024-07-29 NOTE — Progress Notes (Signed)
 POSTPARTUM PROGRESS NOTE  Post Partum Day 1  Subjective:  Mekala Winger is a 23 y.o. [id number] s/p VD at [redacted]w[redacted]d.  She reports she is doing well. No acute events overnight. She denies any problems with ambulating, voiding or po intake. Denies nausea or vomiting.  Pain is well controlled.  Lochia is appropriate. She states baby is not feeding well.   Objective: Blood pressure 111/67, pulse 64, temperature 97.9 F (36.6 C), temperature source Oral, resp. rate 18, height 5' 3 (1.6 m), weight 84.8 kg, last menstrual period 08/04/2023, SpO2 100%, unknown if currently breastfeeding.  Physical Exam:  General: alert, cooperative and no distress Chest: no respiratory distress Heart:regular rate, distal pulses intact Abdomen: soft, nontender,  Uterine Fundus: firm, appropriately tender DVT Evaluation: No calf swelling or tenderness Extremities: No LE edema Skin: warm, dry  Recent Labs    07/28/24 0946 07/29/24 0419  HGB 10.2* 10.1*  HCT 32.0* 30.3*    Assessment/Plan: Zion Lint is a 23 y.o. [id number] s/p VD at [redacted]w[redacted]d   PPD#1 - Doing well  Routine postpartum care Continue oral iron BID Feeding: Formula Plan for circumcision 10/14 as discussed with the nursery.  Dispo: Plan for discharge 10/14.   LOS: 1 day   [employer], DO 07/29/2024, 8:54 AM

## 2024-07-30 MED ORDER — IBUPROFEN 600 MG PO TABS: 600.0000 mg | ORAL_TABLET | Freq: Four times a day (QID) | ORAL | 0 refills | Status: AC | PRN

## 2024-07-30 NOTE — Discharge Summary (Signed)
 Postpartum Discharge Summary  Date of Service updated 07/30/2024     Patient Name: Carol Lopez DOB: [birth year] MRN: [id number]  Date of admission: 07/28/2024 Delivery date:07/28/2024 Delivering provider: ROSALVA SAWYER Date of discharge: 07/30/2024  Admitting diagnosis: Normal labor [O80, Z37.9] Intrauterine pregnancy: [redacted]w[redacted]d     Secondary diagnosis:  Principal Problem:   Normal labor  Additional problems: None    Discharge diagnosis: Term Pregnancy Delivered                                              Post partum procedures:None Augmentation: AROM Complications: None  Hospital course: Onset of Labor With Vaginal Delivery      23 y.o. yo [id number] at [redacted]w[redacted]d was admitted in Active Labor on 07/28/2024. Labor course was uncomplicated . Membrane Rupture Time/Date: 2:36 PM,07/28/2024  Delivery Method:Vaginal, Spontaneous Operative Delivery:N/A Episiotomy: None Lacerations:  None Patient had a postpartum course that was  uncomplicated.  She is ambulating, tolerating a regular diet, passing flatus, and urinating well. Patient is discharged home in stable condition on 07/30/24.  Newborn Data: Birth date:07/28/2024 Birth time:3:49 PM Gender:Female Living status:Living Apgars:8 ,9  Weight:3720 g  Magnesium Sulfate received: No BMZ received: No Rhophylac:N/A MMR:N/A T-DaP:Given prenatally Flu: N/A RSV Vaccine received: No Transfusion:No Immunizations administered:  There is no immunization history on file for this patient.  Physical exam  Vitals:   07/29/24 0733 07/29/24 1430 07/29/24 2114 07/30/24 0558  BP: 111/67 114/76 102/60 (!) 103/59  Pulse: 64 65 76 76  Resp: 18 17 18 18   Temp: 97.9 F (36.6 C) 98.1 F (36.7 C) 98.7 F (37.1 C) 98 F (36.7 C)  TempSrc: Oral Oral    SpO2: 100% 99% 100% 100%  Weight:      Height:       General: alert, cooperative, and no distress Lochia: appropriate Uterine Fundus: firm Incision: N/A DVT Evaluation: No evidence of  DVT seen on physical exam. Labs: Lab Results  Component Value Date   WBC 9.4 07/29/2024   HGB 10.1 (L) 07/29/2024   HCT 30.3 (L) 07/29/2024   MCV 77.3 (L) 07/29/2024   PLT 229 07/29/2024      Latest Ref Rng & Units 07/11/2024    9:48 AM  CMP  Glucose 70 - 99 mg/dL 88   BUN 6 - 20 mg/dL 8   Creatinine 9.55 - 8.99 mg/dL 9.35   Sodium 864 - 854 mmol/L 138   Potassium 3.5 - 5.1 mmol/L 3.7   Chloride 98 - 111 mmol/L 106   CO2 22 - 32 mmol/L 22   Calcium  8.9 - 10.3 mg/dL 8.7   Total Protein 6.5 - 8.1 g/dL 6.2   Total Bilirubin 0.0 - 1.2 mg/dL 0.7   Alkaline Phos 38 - 126 U/L 125   AST 15 - 41 U/L 21   ALT 0 - 44 U/L 13    Edinburgh Score:    07/29/2024    9:16 PM  Edinburgh Postnatal Depression Scale Screening Tool  I have been able to laugh and see the funny side of things. 0  I have looked forward with enjoyment to things. 0  I have blamed myself unnecessarily when things went wrong. 0  I have been anxious or worried for no good reason. 0  I have felt scared or panicky for no good reason. 0  Things have been getting  on top of me. 0  I have been so unhappy that I have had difficulty sleeping. 0  I have felt sad or miserable. 0  I have been so unhappy that I have been crying. 0  The thought of harming myself has occurred to me. 0  Edinburgh Postnatal Depression Scale Total 0      After visit meds:  Allergies as of 07/30/2024   No Known Allergies      Medication List     STOP taking these medications    ferrous sulfate  325 (65 FE) MG tablet   hyoscyamine  0.125 MG SL tablet Commonly known as: Levsin /SL   ondansetron  4 MG disintegrating tablet Commonly known as: ZOFRAN -ODT       TAKE these medications    acetaminophen  500 MG tablet Commonly known as: TYLENOL  Take 1,000 mg by mouth every 6 (six) hours as needed for fever or moderate pain.   ibuprofen  600 MG tablet Commonly known as: ADVIL  Take 1 tablet (600 mg total) by mouth every 6 (six) hours as  needed for mild pain (pain score 1-3).   Prenatal Vitamin 27-0.8 MG Tabs Take by mouth.         Discharge home in stable condition Infant Feeding: Bottle and Breast Infant Disposition:home with mother Discharge instruction: per After Visit Summary and Postpartum booklet. Activity: Advance as tolerated. Pelvic rest for 6 weeks.  Diet: routine diet Anticipated Birth Control: Unsure Postpartum Appointment:6 weeks Additional Postpartum F/U: None Future Appointments:No future appointments. Follow up Visit:  Follow-up Information     Rosalva Sawyer, MD. Schedule an appointment as soon as possible for a visit in 6 week(s).   Specialty: Obstetrics and Gynecology Contact information: [address] [place] [address] [phone]                     07/30/2024 Sawyer Rosalva, MD

## 2024-08-06 ENCOUNTER — Telehealth (HOSPITAL_COMMUNITY): Payer: Self-pay | Admitting: *Deleted

## 2024-08-06 NOTE — Telephone Encounter (Signed)
 08/06/2024  Name: Carol Lopez MRN: [id number] DOB: [birth year]  Reason for Call:  Transition of Care Hospital Discharge Call  Contact Status: Patient Contact Status: Complete  Language assistant needed: Interpreter Mode: Interpreter Not Needed        Follow-Up Questions: Do You Have Any Concerns About Your Health As You Heal From Delivery?: No Do You Have Any Concerns About Your Infants Health?: No  Edinburgh Postnatal Depression Scale:  In the Past 7 Days:    PHQ2-9 Depression Scale:     Discharge Follow-up: Edinburgh score requires follow up?:  (patient says answers are the same as in the hospital when score was 0, endorses she is doing well emotionally) Patient was advised of the following resources:: Support Group, Breastfeeding Support Group (patient declines postpartum group information via email)  Post-discharge interventions: Reviewed Newborn Safe Sleep Practices  Mliss Sieve, RN 08/06/2024 14:18
